# Patient Record
Sex: Female | Born: 1985 | Race: Black or African American | Hispanic: No | Marital: Married | Smoking: Never smoker
Health system: Southern US, Community
[De-identification: ages and names within clinical notes are randomized; demographics above are authoritative.]

## PROBLEM LIST (undated history)

## (undated) ENCOUNTER — Inpatient Hospital Stay (HOSPITAL_COMMUNITY): Payer: Self-pay

## (undated) DIAGNOSIS — Z789 Other specified health status: Secondary | ICD-10-CM

## (undated) HISTORY — PX: NO PAST SURGERIES: SHX2092

---

## 2008-07-04 ENCOUNTER — Ambulatory Visit (HOSPITAL_COMMUNITY): Admission: RE | Admit: 2008-07-04 | Discharge: 2008-07-04 | Payer: Self-pay | Admitting: Orthopedic Surgery

## 2009-12-15 ENCOUNTER — Emergency Department (HOSPITAL_COMMUNITY): Admission: EM | Admit: 2009-12-15 | Discharge: 2009-12-15 | Payer: Self-pay | Admitting: Emergency Medicine

## 2013-08-01 ENCOUNTER — Other Ambulatory Visit: Payer: Self-pay | Admitting: Infectious Disease

## 2013-08-01 ENCOUNTER — Ambulatory Visit
Admission: RE | Admit: 2013-08-01 | Discharge: 2013-08-01 | Disposition: A | Discharge status: Home / Self Care | Payer: No Typology Code available for payment source | Source: Ambulatory Visit | Attending: Infectious Disease | Admitting: Infectious Disease

## 2013-08-01 DIAGNOSIS — R7611 Nonspecific reaction to tuberculin skin test without active tuberculosis: Secondary | ICD-10-CM

## 2013-12-11 ENCOUNTER — Ambulatory Visit (INDEPENDENT_AMBULATORY_CARE_PROVIDER_SITE_OTHER): Payer: BC Managed Care – PPO | Admitting: Emergency Medicine

## 2013-12-11 VITALS — BP 120/74 | HR 68 | Temp 98.2°F | Resp 16 | Ht 62.5 in | Wt 142.8 lb

## 2013-12-11 DIAGNOSIS — Z0000 Encounter for general adult medical examination without abnormal findings: Secondary | ICD-10-CM

## 2013-12-11 DIAGNOSIS — Z0289 Encounter for other administrative examinations: Secondary | ICD-10-CM

## 2013-12-11 NOTE — Progress Notes (Addendum)
   Subjective:  This chart was scribed for Viviann Spare A. Nathan Moctezuma MD,   by Ashley Jacobs, [facility] Scribe. The patient was seen in room 8 and the patient's care was started at 1:28 PM.  Patient ID: Carolyn Webb, female    DOB: [birth year], 28 y.o.   MRN: [id number] Chief Complaint  Patient presents with  . Annual Exam    work   HPI HPI Comments: Carolyn Webb is a 28 y.o. female who arrives to the [facility] for an annual exam. Pt denies any prior medical conditions.She has seasonal allergies that is mostly in the Spring. Angelique Blonder possible pregnancy. Pt has an IUD. 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    There are no active problems to display for this patient.  History reviewed. No pertinent past medical history. History reviewed. No pertinent past surgical history. Allergies  Allergen Reactions  . Sulfa Antibiotics Nausea Only   Prior to Admission medications   Not on File   History   Social History  . Marital Status: Single    Spouse Name: N/A    Number of Children: N/A  . Years of Education: N/A   Occupational History  . Not on file.   Social History Main Topics  . Smoking status: Never Smoker   . Smokeless tobacco: Not on file  . Alcohol Use: 0.6 oz/week    1 Cans of beer per week  . Drug Use: No  . Sexual Activity: Not on file   Other Topics Concern  . Not on file   Social History Narrative  . No narrative on file     \Review of Systems  Genitourinary: Negative for menstrual problem.  Allergic/Immunologic: Positive for environmental allergies.       Objective:   Physical Exam  CONSTITUTIONAL: Well developed/well nourished HEAD: Normocephalic/atraumatic EYES: EOMI/PERRL ENMT: Mucous membranes moist NECK: supple no meningeal signs SPINE:entire spine nontender CV: S1/S2 noted, no murmurs/rubs/gallops noted LUNGS: Lungs are clear to  auscultation bilaterally, no apparent distress ABDOMEN: soft, nontender, no rebound or guarding GU:no cva tenderness NEURO: Pt is awake/alert, moves all extremitiesx4 EXTREMITIES: pulses normal, full ROM SKIN: warm, color normal PSYCH: no abnormalities of mood noted        Assessment & Plan:  This is a healthy 28 year old female. 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 She states she has had previous chest x-ray and quantiferrin gold I personally performed the services described in this documentation, which was scribed in my presence. The recorded information has been reviewed and is accurate.chest x-rays in the system and was done in January 2015 and showed no acute disease

## 2015-05-14 ENCOUNTER — Other Ambulatory Visit (HOSPITAL_COMMUNITY)
Admission: RE | Admit: 2015-05-14 | Discharge: 2015-05-14 | Disposition: A | Discharge status: Home / Self Care | Payer: Commercial Managed Care - PPO | Source: Ambulatory Visit | Attending: Gynecology | Admitting: Gynecology

## 2015-05-14 ENCOUNTER — Encounter: Payer: Self-pay | Admitting: Women's Health

## 2015-05-14 ENCOUNTER — Ambulatory Visit (INDEPENDENT_AMBULATORY_CARE_PROVIDER_SITE_OTHER): Payer: Commercial Managed Care - PPO | Admitting: Women's Health

## 2015-05-14 VITALS — BP 118/78 | Ht 62.0 in | Wt 133.0 lb

## 2015-05-14 DIAGNOSIS — Z30011 Encounter for initial prescription of contraceptive pills: Secondary | ICD-10-CM

## 2015-05-14 DIAGNOSIS — Z30432 Encounter for removal of intrauterine contraceptive device: Secondary | ICD-10-CM

## 2015-05-14 DIAGNOSIS — Z01419 Encounter for gynecological examination (general) (routine) without abnormal findings: Secondary | ICD-10-CM | POA: Insufficient documentation

## 2015-05-14 DIAGNOSIS — Z23 Encounter for immunization: Secondary | ICD-10-CM | POA: Diagnosis not present

## 2015-05-14 LAB — CBC WITH DIFFERENTIAL/PLATELET
BASOS PCT: 1 % (ref 0–1)
Basophils Absolute: 0 10*3/uL (ref 0.0–0.1)
EOS ABS: 0.1 10*3/uL (ref 0.0–0.7)
Eosinophils Relative: 3 % (ref 0–5)
HCT: 39.6 % (ref 36.0–46.0)
HEMOGLOBIN: 13.5 g/dL (ref 12.0–15.0)
LYMPHS ABS: 1.4 10*3/uL (ref 0.7–4.0)
Lymphocytes Relative: 37 % (ref 12–46)
MCH: 31 pg (ref 26.0–34.0)
MCHC: 34.1 g/dL (ref 30.0–36.0)
MCV: 91 fL (ref 78.0–100.0)
MONO ABS: 0.3 10*3/uL (ref 0.1–1.0)
MONOS PCT: 8 % (ref 3–12)
MPV: 9 fL (ref 8.6–12.4)
NEUTROS ABS: 1.9 10*3/uL (ref 1.7–7.7)
Neutrophils Relative %: 51 % (ref 43–77)
Platelets: 356 10*3/uL (ref 150–400)
RBC: 4.35 MIL/uL (ref 3.87–5.11)
RDW: 14 % (ref 11.5–15.5)
WBC: 3.7 10*3/uL — ABNORMAL LOW (ref 4.0–10.5)

## 2015-05-14 MED ORDER — NORGESTIMATE-ETH ESTRADIOL 0.25-35 MG-MCG PO TABS: 1.0000 | ORAL_TABLET | Freq: Every day | ORAL | Status: DC

## 2015-05-14 NOTE — Patient Instructions (Signed)
Oral Contraception Information Oral contraceptive pills (OCPs) are medicines taken to prevent pregnancy. OCPs work by preventing the ovaries from releasing eggs. The hormones in OCPs also cause the cervical mucus to thicken, preventing the sperm from entering the uterus. The hormones also cause the uterine lining to become thin, not allowing a fertilized egg to attach to the inside of the uterus. OCPs are highly effective when taken exactly as prescribed. However, OCPs do not prevent sexually transmitted diseases (STDs). Safe sex practices, such as using condoms along with the pill, can help prevent STDs.  Before taking the pill, you may have a physical exam and Pap test. Your health care provider may order blood tests. The health care provider will make sure you are a good candidate for oral contraception. Discuss with your health care provider the possible side effects of the OCP you may be prescribed. When starting an OCP, it can take 2 to 3 months for the body to adjust to the changes in hormone levels in your body.  TYPES OF ORAL CONTRACEPTION  The combination pill--This pill contains estrogen and progestin (synthetic progesterone) hormones. The combination pill comes in 21-day, 28-day, or 91-day packs. Some types of combination pills are meant to be taken continuously (365-day pills). With 21-day packs, you do not take pills for 7 days after the last pill. With 28-day packs, the pill is taken every day. The last 7 pills are without hormones. Certain types of pills have more than 21 hormone-containing pills. With 91-day packs, the first 84 pills contain both hormones, and the last 7 pills contain no hormones or contain estrogen only.  The minipill--This pill contains the progesterone hormone only. The pill is taken every day continuously. It is very important to take the pill at the same time each day. The minipill comes in packs of 28 pills. All 28 pills contain the hormone.  ADVANTAGES OF ORAL  CONTRACEPTIVE PILLS  Decreases premenstrual symptoms.   Treats menstrual period cramps.   Regulates the menstrual cycle.   Decreases a heavy menstrual flow.   May treatacne, depending on the type of pill.   Treats abnormal uterine bleeding.   Treats polycystic ovarian syndrome.   Treats endometriosis.   Can be used as emergency contraception.  THINGS THAT CAN MAKE ORAL CONTRACEPTIVE PILLS LESS EFFECTIVE OCPs can be less effective if:   You forget to take the pill at the same time every day.   You have a stomach or intestinal disease that lessens the absorption of the pill.   You take OCPs with other medicines that make OCPs less effective, such as antibiotics, certain HIV medicines, and some seizure medicines.   You take expired OCPs.   You forget to restart the pill on day 7, when using the packs of 21 pills.  RISKS ASSOCIATED WITH ORAL CONTRACEPTIVE PILLS  Oral contraceptive pills can sometimes cause side effects, such as:  Headache.  Nausea.  Breast tenderness.  Irregular bleeding or spotting. Combination pills are also associated with a small increased risk of:  Blood clots.  Heart attack.  Stroke.   This information is not intended to replace advice given to you by your health care provider. Make sure you discuss any questions you have with your health care provider.   Document Released: 09/24/2002 Document Revised: 04/24/2013 Document Reviewed: 12/23/2012 Elsevier Interactive Patient Education 2016 Elsevier Inc. Health Maintenance, Female Adopting a healthy lifestyle and getting preventive care can go a long way to promote health and wellness. Talk with   your health care provider about what schedule of regular examinations is right for you. This is a good chance for you to check in with your provider about disease prevention and staying healthy. In between checkups, there are plenty of things you can do on your own. Experts have done a lot  of research about which lifestyle changes and preventive measures are most likely to keep you healthy. Ask your health care provider for more information. WEIGHT AND DIET  Eat a healthy diet  Be sure to include plenty of vegetables, fruits, low-fat dairy products, and lean protein.  Do not eat a lot of foods high in solid fats, added sugars, or salt.  Get regular exercise. This is one of the most important things you can do for your health.  Most adults should exercise for at least 150 minutes each week. The exercise should increase your heart rate and make you sweat (moderate-intensity exercise).  Most adults should also do strengthening exercises at least twice a week. This is in addition to the moderate-intensity exercise.  Maintain a healthy weight  Body mass index (BMI) is a measurement that can be used to identify possible weight problems. It estimates body fat based on height and weight. Your health care provider can help determine your BMI and help you achieve or maintain a healthy weight.  For females 20 years of age and older:   A BMI below 18.5 is considered underweight.  A BMI of 18.5 to 24.9 is normal.  A BMI of 25 to 29.9 is considered overweight.  A BMI of 30 and above is considered obese.  Watch levels of cholesterol and blood lipids  You should start having your blood tested for lipids and cholesterol at 27 years of age, then have this test every 5 years.  You may need to have your cholesterol levels checked more often if:  Your lipid or cholesterol levels are high.  You are older than 27 years of age.  You are at high risk for heart disease.  CANCER SCREENING   Lung Cancer  Lung cancer screening is recommended for adults 55-80 years old who are at high risk for lung cancer because of a history of smoking.  A yearly low-dose CT scan of the lungs is recommended for people who:  Currently smoke.  Have quit within the past 15 years.  Have at least a  30-pack-year history of smoking. A pack year is smoking an average of one pack of cigarettes a day for 1 year.  Yearly screening should continue until it has been 15 years since you quit.  Yearly screening should stop if you develop a health problem that would prevent you from having lung cancer treatment.  Breast Cancer  Practice breast self-awareness. This means understanding how your breasts normally appear and feel.  It also means doing regular breast self-exams. Let your health care provider know about any changes, no matter how small.  If you are in your 20s or 30s, you should have a clinical breast exam (CBE) by a health care provider every 1-3 years as part of a regular health exam.  If you are 40 or older, have a CBE every year. Also consider having a breast X-ray (mammogram) every year.  If you have a family history of breast cancer, talk to your health care provider about genetic screening.  If you are at high risk for breast cancer, talk to your health care provider about having an MRI and a mammogram every year.    Breast cancer gene (BRCA) assessment is recommended for women who have family members with BRCA-related cancers. BRCA-related cancers include:  Breast.  Ovarian.  Tubal.  Peritoneal cancers.  Results of the assessment will determine the need for genetic counseling and BRCA1 and BRCA2 testing. Cervical Cancer Your health care provider may recommend that you be screened regularly for cancer of the pelvic organs (ovaries, uterus, and vagina). This screening involves a pelvic examination, including checking for microscopic changes to the surface of your cervix (Pap test). You may be encouraged to have this screening done every 3 years, beginning at age 21.  For women ages 30-65, health care providers may recommend pelvic exams and Pap testing every 3 years, or they may recommend the Pap and pelvic exam, combined with testing for human papilloma virus (HPV), every 5  years. Some types of HPV increase your risk of cervical cancer. Testing for HPV may also be done on women of any age with unclear Pap test results.  Other health care providers may not recommend any screening for nonpregnant women who are considered low risk for pelvic cancer and who do not have symptoms. Ask your health care provider if a screening pelvic exam is right for you.  If you have had past treatment for cervical cancer or a condition that could lead to cancer, you need Pap tests and screening for cancer for at least 20 years after your treatment. If Pap tests have been discontinued, your risk factors (such as having a new sexual partner) need to be reassessed to determine if screening should resume. Some women have medical problems that increase the chance of getting cervical cancer. In these cases, your health care provider may recommend more frequent screening and Pap tests. Colorectal Cancer  This type of cancer can be detected and often prevented.  Routine colorectal cancer screening usually begins at 27 years of age and continues through 27 years of age.  Your health care provider may recommend screening at an earlier age if you have risk factors for colon cancer.  Your health care provider may also recommend using home test kits to check for hidden blood in the stool.  A small camera at the end of a tube can be used to examine your colon directly (sigmoidoscopy or colonoscopy). This is done to check for the earliest forms of colorectal cancer.  Routine screening usually begins at age 50.  Direct examination of the colon should be repeated every 5-10 years through 27 years of age. However, you may need to be screened more often if early forms of precancerous polyps or small growths are found. Skin Cancer  Check your skin from head to toe regularly.  Tell your health care provider about any new moles or changes in moles, especially if there is a change in a mole's shape or  color.  Also tell your health care provider if you have a mole that is larger than the size of a pencil eraser.  Always use sunscreen. Apply sunscreen liberally and repeatedly throughout the day.  Protect yourself by wearing long sleeves, pants, a wide-brimmed hat, and sunglasses whenever you are outside. HEART DISEASE, DIABETES, AND HIGH BLOOD PRESSURE   High blood pressure causes heart disease and increases the risk of stroke. High blood pressure is more likely to develop in:  People who have blood pressure in the high end of the normal range (130-139/85-89 mm Hg).  People who are overweight or obese.  People who are African American.  If you   are 18-39 years of age, have your blood pressure checked every 3-5 years. If you are 40 years of age or older, have your blood pressure checked every year. You should have your blood pressure measured twice--once when you are at a hospital or clinic, and once when you are not at a hospital or clinic. Record the average of the two measurements. To check your blood pressure when you are not at a hospital or clinic, you can use:  An automated blood pressure machine at a pharmacy.  A home blood pressure monitor.  If you are between 55 years and 79 years old, ask your health care provider if you should take aspirin to prevent strokes.  Have regular diabetes screenings. This involves taking a blood sample to check your fasting blood sugar level.  If you are at a normal weight and have a low risk for diabetes, have this test once every three years after 27 years of age.  If you are overweight and have a high risk for diabetes, consider being tested at a younger age or more often. PREVENTING INFECTION  Hepatitis B  If you have a higher risk for hepatitis B, you should be screened for this virus. You are considered at high risk for hepatitis B if:  You were born in a country where hepatitis B is common. Ask your health care provider which countries  are considered high risk.  Your parents were born in a high-risk country, and you have not been immunized against hepatitis B (hepatitis B vaccine).  You have HIV or AIDS.  You use needles to inject street drugs.  You live with someone who has hepatitis B.  You have had sex with someone who has hepatitis B.  You get hemodialysis treatment.  You take certain medicines for conditions, including cancer, organ transplantation, and autoimmune conditions. Hepatitis C  Blood testing is recommended for:  Everyone born from 1945 through 1965.  Anyone with known risk factors for hepatitis C. Sexually transmitted infections (STIs)  You should be screened for sexually transmitted infections (STIs) including gonorrhea and chlamydia if:  You are sexually active and are younger than 27 years of age.  You are older than 27 years of age and your health care provider tells you that you are at risk for this type of infection.  Your sexual activity has changed since you were last screened and you are at an increased risk for chlamydia or gonorrhea. Ask your health care provider if you are at risk.  If you do not have HIV, but are at risk, it may be recommended that you take a prescription medicine daily to prevent HIV infection. This is called pre-exposure prophylaxis (PrEP). You are considered at risk if:  You are sexually active and do not regularly use condoms or know the HIV status of your partner(s).  You take drugs by injection.  You are sexually active with a partner who has HIV. Talk with your health care provider about whether you are at high risk of being infected with HIV. If you choose to begin PrEP, you should first be tested for HIV. You should then be tested every 3 months for as long as you are taking PrEP.  PREGNANCY   If you are premenopausal and you may become pregnant, ask your health care provider about preconception counseling.  If you may become pregnant, take 400 to  800 micrograms (mcg) of folic acid every day.  If you want to prevent pregnancy, talk to your   health care provider about birth control (contraception). OSTEOPOROSIS AND MENOPAUSE   Osteoporosis is a disease in which the bones lose minerals and strength with aging. This can result in serious bone fractures. Your risk for osteoporosis can be identified using a bone density scan.  If you are 65 years of age or older, or if you are at risk for osteoporosis and fractures, ask your health care provider if you should be screened.  Ask your health care provider whether you should take a calcium or vitamin D supplement to lower your risk for osteoporosis.  Menopause may have certain physical symptoms and risks.  Hormone replacement therapy may reduce some of these symptoms and risks. Talk to your health care provider about whether hormone replacement therapy is right for you.  HOME CARE INSTRUCTIONS   Schedule regular health, dental, and eye exams.  Stay current with your immunizations.   Do not use any tobacco products including cigarettes, chewing tobacco, or electronic cigarettes.  If you are pregnant, do not drink alcohol.  If you are breastfeeding, limit how much and how often you drink alcohol.  Limit alcohol intake to no more than 1 drink per day for nonpregnant women. One drink equals 12 ounces of beer, 5 ounces of wine, or 1 ounces of hard liquor.  Do not use street drugs.  Do not share needles.  Ask your health care provider for help if you need support or information about quitting drugs.  Tell your health care provider if you often feel depressed.  Tell your health care provider if you have ever been abused or do not feel safe at home.   This information is not intended to replace advice given to you by your health care provider. Make sure you discuss any questions you have with your health care provider.   Document Released: 01/17/2011 Document Revised: 07/25/2014  Document Reviewed: 06/05/2013 Elsevier Interactive Patient Education 2016 Elsevier Inc.  

## 2015-05-14 NOTE — Progress Notes (Signed)
Carolyn AveShakia F Webb 01/15/1986 [id number]    History:    Presents for new patient annual exam requesting IUD removal. Mirena IUD placed 3 years ago amenorrhea. Normal Pap history. Did not complete gardasil series. Same partner years. Currently on doxycycline for acne.  Past medical history, past surgical history, family history and social history were all reviewed and documented in the EPIC chart. 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 [employer] and [employer] in [employer].  ROS:  A ROS was performed and pertinent positives and negatives are included.  Exam:  Filed Vitals:   05/14/15 1211  BP: 118/78    General appearance:  Normal Thyroid:  Symmetrical, normal in size, without palpable masses or nodularity. Respiratory  Auscultation:  Clear without wheezing or rhonchi Cardiovascular  Auscultation:  Regular rate, without rubs, murmurs or gallops  Edema/varicosities:  Not grossly evident Abdominal  Soft,nontender, without masses, guarding or rebound.  Liver/spleen:  No organomegaly noted  Hernia:  None appreciated  Skin  Inspection:  Grossly normal   Breasts: Examined lying and sitting.     Right: Without masses, retractions, discharge or axillary adenopathy.     Left: Without masses, retractions, discharge or axillary adenopathy. Gentitourinary   Inguinal/mons:  Normal without inguinal adenopathy  External genitalia:  Normal  BUS/Urethra/Skene's glands:  Normal  Vagina:  Normal  Cervix:  Normal IUD strings seen, ring forcep use removed intact shown to patient and discarded  Uterus:  normal in size, shape and contour.  Midline and mobile  Adnexa/parametria:     Rt: Without masses or tenderness.   Lt: Without masses or tenderness.  Anus and perineum: Normal    Assessment/Plan:  29 y.o. MBF G0  for annual exam.     IUD removed Contraception management Acne  Plan: Options reviewed will try Sprintec prescription, proper use given and reviewed will start  Sunday take daily. Reviewed importance of condoms first month and then contraceptive. Reviewed may help with acne. Reviewed importance of using contraception the entire time on tetracycline for acne per dermatologist. SBE's, exercise, calcium rich diet, MVI daily encouraged. CBC, UA, rubella titer, Pap.    Harrington ChallengerYOUNG,NANCY J WHNP, 1:11 PM 05/14/2015

## 2015-05-15 ENCOUNTER — Telehealth: Payer: Self-pay | Admitting: *Deleted

## 2015-05-15 LAB — URINALYSIS W MICROSCOPIC + REFLEX CULTURE
BACTERIA UA: NONE SEEN [HPF]
Bilirubin Urine: NEGATIVE
CRYSTALS: NONE SEEN [HPF]
Casts: NONE SEEN [LPF]
Glucose, UA: NEGATIVE
Hgb urine dipstick: NEGATIVE
Ketones, ur: NEGATIVE
LEUKOCYTES UA: NEGATIVE
Nitrite: NEGATIVE
PROTEIN: NEGATIVE
RBC / HPF: NONE SEEN RBC/HPF (ref <=2)
SPECIFIC GRAVITY, URINE: 1.027 (ref 1.001–1.035)
WBC, UA: NONE SEEN WBC/HPF (ref <=5)
YEAST: NONE SEEN [HPF]
pH: 6 (ref 5.0–8.0)

## 2015-05-15 LAB — RUBELLA SCREEN: RUBELLA: 13.5 {index} — AB (ref <0.90)

## 2015-05-15 NOTE — Telephone Encounter (Signed)
Pt requested Flu Vaccine documentation be sent to her job. Fax # [phone] Lyn RecordsAttn Marcie. Complete KW CMA

## 2015-05-19 LAB — CYTOLOGY - PAP

## 2016-03-31 ENCOUNTER — Other Ambulatory Visit (HOSPITAL_COMMUNITY): Payer: Self-pay | Admitting: Obstetrics and Gynecology

## 2016-03-31 ENCOUNTER — Encounter: Payer: Self-pay | Admitting: Genetic Counselor

## 2016-03-31 DIAGNOSIS — N926 Irregular menstruation, unspecified: Secondary | ICD-10-CM

## 2016-04-05 ENCOUNTER — Ambulatory Visit (HOSPITAL_COMMUNITY)
Admission: RE | Admit: 2016-04-05 | Discharge: 2016-04-05 | Disposition: A | Discharge status: Home / Self Care | Payer: Commercial Managed Care - PPO | Source: Ambulatory Visit | Attending: Obstetrics and Gynecology | Admitting: Obstetrics and Gynecology

## 2016-04-05 ENCOUNTER — Encounter (INDEPENDENT_AMBULATORY_CARE_PROVIDER_SITE_OTHER): Payer: Self-pay

## 2016-04-05 DIAGNOSIS — N926 Irregular menstruation, unspecified: Secondary | ICD-10-CM

## 2016-04-05 MED ORDER — IOPAMIDOL (ISOVUE-300) INJECTION 61%
30.0000 mL | Freq: Once | INTRAVENOUS | Status: AC | PRN
  Administered 2016-04-05: 6 mL

## 2016-04-06 ENCOUNTER — Telehealth: Payer: Self-pay | Admitting: Genetic Counselor

## 2016-04-06 NOTE — Telephone Encounter (Signed)
Pt returned call and was [facility] the [facility] schedule [facility] due to the type of genetic testing requested.  The referring provider was also contacted and [facility].

## 2016-04-21 ENCOUNTER — Ambulatory Visit (HOSPITAL_COMMUNITY): Payer: Commercial Managed Care - PPO

## 2016-04-21 ENCOUNTER — Ambulatory Visit (HOSPITAL_COMMUNITY)
Admission: RE | Admit: 2016-04-21 | Discharge: 2016-04-21 | Disposition: A | Discharge status: Home / Self Care | Payer: Commercial Managed Care - PPO | Source: Ambulatory Visit | Attending: Obstetrics and Gynecology | Admitting: Obstetrics and Gynecology

## 2016-04-21 DIAGNOSIS — Z820 Family history of epilepsy and other diseases of the nervous system: Secondary | ICD-10-CM

## 2016-04-22 ENCOUNTER — Encounter (HOSPITAL_COMMUNITY): Payer: Self-pay

## 2016-04-22 DIAGNOSIS — Z820 Family history of epilepsy and other diseases of the nervous system: Secondary | ICD-10-CM | POA: Insufficient documentation

## 2016-04-22 NOTE — Progress Notes (Signed)
Genetic Counseling  Preconception Note  Appointment Date:  04/21/2016 Referred By: Servando Salina, MD Date of Birth:  1986-01-29 Attending: Renella Cunas, MD    I met with [name] for preconception genetic counseling because of a family history of Duchenne muscular dystrophy.  In summary:  Discussed family history of Duchenne muscular dystrophy  Patient's two maternal uncles, two maternal great-uncles, and a maternal female cousin reported to have had DMD  Reviewed X-linked inheritance of DMD  Carrier females have a 1 in 34 chance for affected son and 1 in 36 chance for daughter who is a carrier  Prior to screening for the patient, her risk to be a carrier is 1 in 4 (25%)  DMD carrier screening  Given when the affected relatives were diagnosed and passed away, Carolyn Webb thinks genetic testing was not likely performed  Carrier testing is most informative when familial causative variant is first identified  Carrier testing could first start with Carolyn Webb's maternal grandmother (obligate carrier) and then proceed with testing Carolyn Webb for familial variant, if identified  Carrier screening for DMD gene available to Carolyn Webb, in the absence of known familial variant, detection rate approximately 90%  Carolyn Webb plans to investigate her insurance coverage and also inquire with her grandmother about carrier testing; she will call our office back when ready to proceed with DMD carrier testing  Female carriers are recommended to have routine cardiac screening given the increased chance for cardiomyopathy  Discussed general population carrier screening options  CF-performed through OB; currently pending  SMA-declined today; may pursue at later date with additional labwork  Hemoglobinopathies-declined today; may pursue at a later date  We began by reviewing the family history in detail. Carolyn Webb reported multiple maternal relatives with  Duchenne Muscular Dystrophy (DMD). She reported two maternal uncles, who were each reportedly diagnosed in their early teenage years and progressed to requiring the use of a wheelchair. One uncle died at age 63 years old, and the other died at age 61 years old. They were diagnosed and followed through [facility]. The patient reported that her mother is currently 50 years old, and they were older siblings to her mother. Additionally, the patient's maternal grandmother had two brothers with DMD, who also died in their 19's. The patient's maternal grandmother had a maternal half-sister with a son with DMD who died at age 36 years old. Given the time frame for these individuals, the patient reported that she does not think genetic testing was performed for any of the affected relatives or any of the females in the family.  She reported that there are no symptomatic females in the family. Carolyn Webb has two maternal half-sisters who do not have children and no maternal brothers.   Duchenne muscular dystrophy (DMD) is an X-linked genetic condition involving progressive muscular degeneration. DMD is part of the dystrophinopathy spectrum, which includes a range of muscle disease ranging from mild to severe. DMD typically presents in childhood with delayed milestones. Affected children are typically wheelchair dependent by age 58 years. Onset of cardiomyopathy typically occurs after age 74 years. Individuals typically do not survive past their 88s, primarily due to cardiomyopathy and respiratory complications. Males with DMD may have inherited the mutation from a carrier mother or have the condition as the result of a de novo mutation. The de novo mutation rate for DMD has been estimated to be 1/3.   We reviewed genes, chromosomes, and inheritance patterns. We reviewed that the  23rd chromosome pair for humans is referred to as the sex chromosomes. Males typically have one X and one Y chromosome, and females  typically have two X chromosomes. In X-linked recessive inheritance, each female pregnancy of a female carrier has a 50% (1 in 2) risk for DMD.  Each female pregnancy of a female carrier has a 50% to be a carrier.  Thus, there is a 1 in 4 chance for a carrier female to have an affected son. Most carrier females have no symptoms of the disease, although some carriers can have muscle weakness, muscle cramps, heart disease, and rarely can exhibit classic features of DMD.  Symptoms of a female carrier can range even within the same family, just as the disease itself can be variable in affected relatives within the same family. Given the associated risk for cardiomyopathy in carrier females, it is recommended for female carriers to have a complete cardiac evaluation and routine cardiac screening every 5 years starting at approximately age 59-29 years old. While DMD shortens the lifespan of an affected female, this is typically not the case for a carrier female. In X-linked recessive inheritance, all daughters of an affected female would be obligate carriers and all sons of an affected female would be neither affected nor carriers.   Based on the reported family history, we discussed that the patient's maternal grandmother would be an obligate carrier for DMD. Thus, the patient would have a 1 in 4 chance to be a carrier of DMD.  We discussed that in the case of a different type of muscular dystrophy in the family, recurrence risk estimate may change as there are various other forms of muscular dystrophy that have variable features and ages of onset and may follow different inheritance patterns and the available genetic testing for carrier screening would differ.  It would be most informative to obtain medical documentation of the affected relatives' diagnosis to confirm that the underlying muscular dystrophy is DMD.  Molecular testing of the DMD gene is clinically available. We discussed that over 5,000 pathogenic variants  have been identified in the DMD gene for individuals with varying range of dystrophinopathies. For males with DMD, approximately 55-75% are found to have a DMD deletion or duplication, and approximately 20-35% have a pathogenic single nucleotide variant. However, there are some individuals with clinical features of a dystrophinopathy that do not have an identified variant in the DMD gene. Given this information, we discussed that genetic testing is most informative when started in either an affected relative or a female who is an obligate carrier. In Carolyn Webb case, her maternal grandmother, who is an obligate carrier, would be the most informative relative to initiate DMD genetic testing. If a causative pathogenic variant is identified in her, then at risk relatives, such as Carolyn Webb could then be tested for the identified familial variant. Alternatively, we discussed that while potentially less informative, DMD carrier screening is available to Carolyn Webb without testing other relatives. Current DMD genetic testing assessing for duplications, deletions, and through sequence analysis is estimated to have a 90% detection rate for DMD carriers. In this second scenario, we reviewed that a negative result would not rule out carrier status given that the familial variant would be unknown, but would reduce her risk to be a carrier. We also reviewed potential costs of genetic testing. We reviewed benefits and limitations of testing. We discussed the possible results that the tests might provide including: positive, negative, unanticipated, and no result. We also reviewed  that given the de novo mutation rate for DMD, a negative carrier screen, even in the case of a negative test for a known familial variant, cannot completely rule out the chance of having an affected pregnancy.   In the case that a female is identified to carry a DMD pathogenic variant, we discussed options regarding a future pregnancy.  We  reviewed that female carriers have a 1 in 4 (25%) chance to have a female carrier and a 1 in 4 (25%) chance to have an affected son.  Prenatal diagnosis would be available in a future pregnancy for a known familial pathogenic variant via chorionic villus sampling (CVS) or amniocentesis. We reviewed risks, benefits, and limitations of these.  Additionally, while not diagnostic for DMD, screening for fetal sex would be available via noninvasive prenatal screening (NIPS)/prenatal cell free DNA testing and/or targeted ultrasound in order to further refine recurrence risk.  Identified female carriers would also have the option of preimplantation genetic diagnosis (PGD), meaning that embryo(s) obtained through in vitro fertilization would be tested for the familial mutations prior to the implantation of unaffected embryo(s) into the uterus.   Carolyn Webb expressed interest in pursuing DMD carrier testing.  She wanted to first investigate her insurance coverage including the deductible for her specific plan. She also planned to talk with her grandmother about beginning carrier testing with her. Given that her grandmother lives in [place], with the nearest genetic counselor approximately one hour away, we discussed the potential option of coordinating sample collection by the genetics lab performing the test via a sample collection kit being sent to her grandmother. Carolyn Webb has our office contact number. She plans to call us back once she and/or her grandmother desire to proceed with DMD carrier screening.    The family histories were otherwise found to be contributory for cleft lip and palate for the patient's paternal aunt. This relative had surgical correction and is otherwise healthy. No additional relatives were reported with cleft lip +/- palate, including the aunt's two children. We discussed the ultrasound finding of cleft lip and palate. Cleft lip +/- palate occurs in approximately 1 in  1000 births. Approximately 25% (1 in 4) of all cleft lip and/or palate (CL/P) cases are cleft lip only, 50% (1 in 2) are cleft lip and palate, and 25% (1 in 4) are cleft palate only. Various factors may increase the chance of a CL/P including some prenatal exposures, alcohol and drug use, cigarette smoking, or folic acid deficiency. CL/P is most often an isolated condition, but can be present in combination with other birth defects possibly as part of a genetic syndrome. Approximately, 7-13% of individuals with a cleft lip and 11-14% of individuals with a cleft lip and palate are born with additional birth defects.  Many genetic syndromes are associated with cleft lip and/or palate which may not be identified on ultrasound and would not be detected by amniocentesis.  For this reason, a genetics evaluation may be recommended sometime after birth in order to assess for an underlying genetic syndrome.  When there is no syndrome as the cause, then the cleft lip or palate is typically suspected to be caused by a combination of genetic and environmental factors (multifactorial inheritance). Given the reported family history, recurrence risk for cleft lip +/- palate in the patient's offspring would be approximately 0.3%, assuming multifactorial inheritance for this relative.  Without further information regarding the provided family history, an accurate genetic risk cannot be calculated. Further genetic  counseling is warranted if more information is obtained.  Carolyn Webb was provided with written information regarding cystic fibrosis (CF), spinal muscular atrophy (SMA) and hemoglobinopathies including the carrier frequency, availability of carrier screening and prenatal diagnosis if indicated.  In addition, we discussed that CF and hemoglobinopathies are routinely screened for as part of the [place] newborn screening panel. CF carrier screening was recently drawn through her OB office and is currently pending.  After  further discussion, she declined screening for SMA and hemoglobinopathies today given that she may pursue these at a later date, possibly at the time of DMD carrier screening.  I counseled Carolyn Webb regarding the above risks and available options.  The approximate face-to-face time with the genetic counselor was 55 minutes.  Chipper Oman, MS Certified Genetic Counselor 04/22/2016

## 2016-05-03 ENCOUNTER — Telehealth (HOSPITAL_COMMUNITY): Payer: Self-pay | Admitting: MS"

## 2016-05-03 NOTE — Telephone Encounter (Signed)
Called [name] to follow-up regarding possibility of blood draw for her grandmother for DMD carrier screening. Per discussion with [facility], one of the labs that offers DMD carrier screening, the lab can arrange to send a kit and phlebotomist to the grandmother's house to collect the specimen. Carolyn Webb had a concern about potential driving distance for her grandmother for a blood drawn, given that she lives in [place]. She was happy to hear of this possibility. She stated she is still in the process of checking regarding whether or not her grandmother's insurance would cover this testing. Discussed that once she obtains the type of insurance her grandmother has, we can also check with the laboratory to see if they are in-network or out of network with her insurance provider. Carolyn Webb plans to call me back once she has additional information and/or if she or her grandmother would like to pursue DMD carrier screening.   Carolyn Webb 05/03/2016 11:01 AM

## 2016-05-16 ENCOUNTER — Other Ambulatory Visit: Payer: Commercial Managed Care - PPO

## 2016-05-16 ENCOUNTER — Encounter: Payer: Commercial Managed Care - PPO | Admitting: Genetic Counselor

## 2017-07-18 NOTE — L&D Delivery Note (Signed)
  CTSP re SROM with breech presenting @ introitus  Delivery Note At  a non-viable adult was delivered via  (PresentationFrank breech: ;  ).  APGAR: , ; weight  .   Placenta statusSPONT>>INTACT: , .  Cord:  with the following complications: .  Cord pH: not sent  placetta tp PATH, difficult entrapped vtx after easy breecdh delivery, no FHR noted, NICU here for obsv>>>only agonal , weak pulse  Anesthesia:  IV Stadol Episiotomy:  none Lacerations:  none Suture Repair: NA Est. Blood Loss (mL):  200  Mom to postpartum.  [facility].  Maria Gallicchio Milana ObeyM Nimo Verastegui 10/07/2017, 6:05 PM

## 2017-10-07 ENCOUNTER — Inpatient Hospital Stay (HOSPITAL_COMMUNITY): Payer: BLUE CROSS/BLUE SHIELD

## 2017-10-07 ENCOUNTER — Encounter (HOSPITAL_COMMUNITY): Payer: Self-pay

## 2017-10-07 ENCOUNTER — Encounter (HOSPITAL_COMMUNITY): Payer: Self-pay | Admitting: Emergency Medicine

## 2017-10-07 ENCOUNTER — Inpatient Hospital Stay (HOSPITAL_COMMUNITY)
Admission: AD | Admit: 2017-10-07 | Discharge: 2017-10-08 | DRG: 805 | Disposition: A | Discharge status: Home / Self Care | Payer: BLUE CROSS/BLUE SHIELD | Source: Ambulatory Visit | Attending: Obstetrics and Gynecology | Admitting: Obstetrics and Gynecology

## 2017-10-07 ENCOUNTER — Other Ambulatory Visit: Payer: Self-pay

## 2017-10-07 ENCOUNTER — Emergency Department (HOSPITAL_COMMUNITY)
Admission: EM | Admit: 2017-10-07 | Discharge: 2017-10-07 | Disposition: A | Discharge status: Home / Self Care | Payer: BLUE CROSS/BLUE SHIELD | Attending: Emergency Medicine | Admitting: Emergency Medicine

## 2017-10-07 DIAGNOSIS — O209 Hemorrhage in early pregnancy, unspecified: Secondary | ICD-10-CM

## 2017-10-07 DIAGNOSIS — N883 Incompetence of cervix uteri: Secondary | ICD-10-CM

## 2017-10-07 DIAGNOSIS — Z3A21 21 weeks gestation of pregnancy: Secondary | ICD-10-CM

## 2017-10-07 DIAGNOSIS — O9989 Other specified diseases and conditions complicating pregnancy, childbirth and the puerperium: Secondary | ICD-10-CM | POA: Insufficient documentation

## 2017-10-07 DIAGNOSIS — O200 Threatened abortion: Secondary | ICD-10-CM

## 2017-10-07 DIAGNOSIS — R109 Unspecified abdominal pain: Secondary | ICD-10-CM | POA: Insufficient documentation

## 2017-10-07 DIAGNOSIS — O321XX0 Maternal care for breech presentation, not applicable or unspecified: Secondary | ICD-10-CM | POA: Diagnosis present

## 2017-10-07 DIAGNOSIS — O3432 Maternal care for cervical incompetence, second trimester: Secondary | ICD-10-CM | POA: Diagnosis present

## 2017-10-07 HISTORY — DX: Other specified health status: Z78.9

## 2017-10-07 LAB — CBC WITH DIFFERENTIAL/PLATELET
BASOS ABS: 0 10*3/uL (ref 0.0–0.1)
BASOS PCT: 0 %
Eosinophils Absolute: 0.2 10*3/uL (ref 0.0–0.7)
Eosinophils Relative: 2 %
HEMATOCRIT: 34.5 % — AB (ref 36.0–46.0)
HEMOGLOBIN: 12.1 g/dL (ref 12.0–15.0)
Lymphocytes Relative: 28 %
Lymphs Abs: 1.9 10*3/uL (ref 0.7–4.0)
MCH: 31.7 pg (ref 26.0–34.0)
MCHC: 35.1 g/dL (ref 30.0–36.0)
MCV: 90.3 fL (ref 78.0–100.0)
MONOS PCT: 9 %
Monocytes Absolute: 0.6 10*3/uL (ref 0.1–1.0)
NEUTROS ABS: 4 10*3/uL (ref 1.7–7.7)
NEUTROS PCT: 61 %
Platelets: 309 10*3/uL (ref 150–400)
RBC: 3.82 MIL/uL — ABNORMAL LOW (ref 3.87–5.11)
RDW: 13.4 % (ref 11.5–15.5)
WBC: 6.6 10*3/uL (ref 4.0–10.5)

## 2017-10-07 LAB — ABO/RH: ABO/RH(D): A POS

## 2017-10-07 LAB — TYPE AND SCREEN
ABO/RH(D): A POS
Antibody Screen: NEGATIVE

## 2017-10-07 MED ORDER — BUTORPHANOL TARTRATE 1 MG/ML IJ SOLN
1.0000 mg | Freq: Once | INTRAMUSCULAR | Status: AC
  Administered 2017-10-07: 1 mg via INTRAVENOUS
  Filled 2017-10-07: qty 1

## 2017-10-07 MED ORDER — DIBUCAINE 1 % RE OINT
1.0000 "application " | TOPICAL_OINTMENT | RECTAL | Status: DC | PRN
  Filled 2017-10-07: qty 28

## 2017-10-07 MED ORDER — ONDANSETRON HCL 4 MG/2ML IJ SOLN: 4.0000 mg | INTRAMUSCULAR | Status: DC | PRN

## 2017-10-07 MED ORDER — FLEET ENEMA 7-19 GM/118ML RE ENEM: 1.0000 | ENEMA | Freq: Every day | RECTAL | Status: DC | PRN

## 2017-10-07 MED ORDER — SENNOSIDES-DOCUSATE SODIUM 8.6-50 MG PO TABS: 2.0000 | ORAL_TABLET | ORAL | Status: DC

## 2017-10-07 MED ORDER — BISACODYL 10 MG RE SUPP
10.0000 mg | Freq: Every day | RECTAL | Status: DC | PRN
  Filled 2017-10-07: qty 1

## 2017-10-07 MED ORDER — AMPICILLIN SODIUM 2 G IJ SOLR
2.0000 g | Freq: Four times a day (QID) | INTRAMUSCULAR | Status: DC
  Administered 2017-10-07 (×2): 2 g via INTRAVENOUS
  Filled 2017-10-07: qty 2000
  Filled 2017-10-07: qty 2
  Filled 2017-10-07: qty 2000
  Filled 2017-10-07: qty 2

## 2017-10-07 MED ORDER — ZOLPIDEM TARTRATE 5 MG PO TABS: 5.0000 mg | ORAL_TABLET | Freq: Every evening | ORAL | Status: DC | PRN

## 2017-10-07 MED ORDER — OXYCODONE-ACETAMINOPHEN 5-325 MG PO TABS: 1.0000 | ORAL_TABLET | ORAL | Status: DC | PRN

## 2017-10-07 MED ORDER — LACTATED RINGERS IV SOLN
INTRAVENOUS | Status: DC
  Administered 2017-10-07: 04:00:00 via INTRAVENOUS

## 2017-10-07 MED ORDER — WITCH HAZEL-GLYCERIN EX PADS: 1.0000 "application " | MEDICATED_PAD | CUTANEOUS | Status: DC | PRN

## 2017-10-07 MED ORDER — OXYCODONE-ACETAMINOPHEN 5-325 MG PO TABS: 2.0000 | ORAL_TABLET | ORAL | Status: DC | PRN

## 2017-10-07 MED ORDER — LACTATED RINGERS IV SOLN
500.0000 mL | INTRAVENOUS | Status: DC | PRN
  Administered 2017-10-07: 500 mL via INTRAVENOUS

## 2017-10-07 MED ORDER — BENZOCAINE-MENTHOL 20-0.5 % EX AERO
1.0000 "application " | INHALATION_SPRAY | CUTANEOUS | Status: DC | PRN
  Filled 2017-10-07: qty 56

## 2017-10-07 MED ORDER — MAGNESIUM SULFATE 40 G IN LACTATED RINGERS - SIMPLE
2.0000 g/h | INTRAVENOUS | Status: DC
  Administered 2017-10-07: 1 g/h via INTRAVENOUS
  Filled 2017-10-07: qty 40

## 2017-10-07 MED ORDER — SODIUM CHLORIDE 0.9 % IV SOLN: 250.0000 mg | Freq: Four times a day (QID) | INTRAVENOUS | Status: DC

## 2017-10-07 MED ORDER — ONDANSETRON HCL 4 MG PO TABS: 4.0000 mg | ORAL_TABLET | ORAL | Status: DC | PRN

## 2017-10-07 MED ORDER — IBUPROFEN 800 MG PO TABS: 800.0000 mg | ORAL_TABLET | Freq: Three times a day (TID) | ORAL | Status: DC | PRN

## 2017-10-07 MED ORDER — AZITHROMYCIN 500 MG PO TABS: 500.0000 mg | ORAL_TABLET | Freq: Every day | ORAL | Status: DC

## 2017-10-07 MED ORDER — PRENATAL MULTIVITAMIN CH: 1.0000 | ORAL_TABLET | Freq: Every day | ORAL | Status: DC

## 2017-10-07 MED ORDER — SOD CITRATE-CITRIC ACID 500-334 MG/5ML PO SOLN: 30.0000 mL | ORAL | Status: DC | PRN

## 2017-10-07 MED ORDER — AMOXICILLIN 500 MG PO CAPS: 500.0000 mg | ORAL_CAPSULE | Freq: Three times a day (TID) | ORAL | Status: DC

## 2017-10-07 MED ORDER — MAGNESIUM SULFATE BOLUS VIA INFUSION
4.0000 g | Freq: Once | INTRAVENOUS | Status: AC
  Administered 2017-10-07: 4 g via INTRAVENOUS
  Filled 2017-10-07: qty 500

## 2017-10-07 MED ORDER — ONDANSETRON HCL 4 MG/2ML IJ SOLN: 4.0000 mg | Freq: Four times a day (QID) | INTRAMUSCULAR | Status: DC | PRN

## 2017-10-07 MED ORDER — MEASLES, MUMPS & RUBELLA VAC ~~LOC~~ INJ
0.5000 mL | INJECTION | Freq: Once | SUBCUTANEOUS | Status: DC
  Filled 2017-10-07: qty 0.5

## 2017-10-07 MED ORDER — DIPHENHYDRAMINE HCL 25 MG PO CAPS: 25.0000 mg | ORAL_CAPSULE | Freq: Four times a day (QID) | ORAL | Status: DC | PRN

## 2017-10-07 MED ORDER — ACETAMINOPHEN 325 MG PO TABS: 650.0000 mg | ORAL_TABLET | ORAL | Status: DC | PRN

## 2017-10-07 MED ORDER — OXYTOCIN 40 UNITS IN LACTATED RINGERS INFUSION - SIMPLE MED
2.5000 [IU]/h | INTRAVENOUS | Status: DC
  Administered 2017-10-07: 2.5 [IU]/h via INTRAVENOUS
  Filled 2017-10-07: qty 1000

## 2017-10-07 MED ORDER — SODIUM CHLORIDE 0.9 % IV SOLN
2.0000 g | Freq: Four times a day (QID) | INTRAVENOUS | Status: DC
  Filled 2017-10-07 (×2): qty 2000

## 2017-10-07 MED ORDER — SIMETHICONE 80 MG PO CHEW: 80.0000 mg | CHEWABLE_TABLET | ORAL | Status: DC | PRN

## 2017-10-07 MED ORDER — TETANUS-DIPHTH-ACELL PERTUSSIS 5-2.5-18.5 LF-MCG/0.5 IM SUSP
0.5000 mL | Freq: Once | INTRAMUSCULAR | Status: DC
  Filled 2017-10-07: qty 0.5

## 2017-10-07 MED ORDER — COCONUT OIL OIL
1.0000 "application " | TOPICAL_OIL | Status: DC | PRN
  Filled 2017-10-07: qty 120

## 2017-10-07 MED ORDER — LACTATED RINGERS IV SOLN
INTRAVENOUS | Status: DC
  Administered 2017-10-07 (×3): via INTRAVENOUS

## 2017-10-07 MED ORDER — OXYTOCIN BOLUS FROM INFUSION
500.0000 mL | Freq: Once | INTRAVENOUS | Status: AC
  Administered 2017-10-07: 500 mL via INTRAVENOUS

## 2017-10-07 MED ORDER — AZITHROMYCIN 500 MG IV SOLR
500.0000 mg | INTRAVENOUS | Status: DC
  Administered 2017-10-07: 500 mg via INTRAVENOUS
  Filled 2017-10-07: qty 500

## 2017-10-07 NOTE — MAU Provider Note (Signed)
History     CSN: [id number]  Arrival date and time: 10/07/17 [id number]   First Provider Initiated Contact with Patient 10/07/17 0355      Chief Complaint  Patient presents with  . Vaginal Bleeding  . Abdominal Pain   HPI  [name] is a 32 y.o. G1P0000 at 7547w5d who presents to MAU today with complaint of abdominal pain and vaginal bleeding. The patient states that the abdominal cramping will come in waves and then resolve to minimal aching in between episodes. She states pain has increased in frequency and severity since onset around 8pm-9pm last night. She states normal anatomy US on 09/29/17 with anterior placenta. No mention from the OB of previa or other concerns. [facility] and then [facility] bleeding so she called her OB office and was told to come here.   OB History    Gravida  1   Para  0   Term  0   Preterm  0   AB  0   Living  0     SAB  0   TAB  0   Ectopic  0   Multiple  0   Live Births              Past Medical History:  Diagnosis Date  . Medical history non-contributory     Past Surgical History:  Procedure Laterality Date  . NO PAST SURGERIES      Family History  Problem Relation Age of Onset  . Hypertension Father   . Hypertension Maternal Grandmother   . Diabetes Paternal Grandmother   . High Cholesterol Unknown   . Muscular dystrophy Maternal Uncle   . Cleft lip Paternal Aunt   . Muscular dystrophy Maternal Uncle   . Muscular dystrophy Cousin     Social History   Tobacco Use  . Smoking status: Never Smoker  Substance Use Topics  . Alcohol use: Yes    Alcohol/week: 0.6 oz    Types: 1 Cans of beer per week  . Drug use: No    Allergies:  Allergies  Allergen Reactions  . Sulfa Antibiotics Nausea Only    Medications Prior to Admission  Medication Sig Dispense Refill Last Dose  . Prenatal Vit-Fe Fumarate-FA (PRENATAL MULTIVITAMIN) TABS tablet Take 1 tablet by mouth daily at 12 noon.   10/06/2017 at  Unknown time  . norgestimate-ethinyl estradiol (ORTHO-CYCLEN,SPRINTEC,PREVIFEM) 0.25-35 MG-MCG tablet Take 1 tablet by mouth daily. 3 Package 4     Review of Systems  Constitutional: Negative for fever.  Gastrointestinal: Positive for abdominal pain. Negative for constipation, diarrhea, nausea and vomiting.  Genitourinary: Positive for vaginal bleeding. Negative for vaginal discharge.   Physical Exam   Blood pressure 118/65, pulse 80, temperature 98.7 F (37.1 C), resp. rate 17, height 5\' 2"  (1.575 m), weight 152 lb 4 oz (69.1 kg), last menstrual period 04/28/2017, SpO2 100 %.  Physical Exam  Nursing note and vitals reviewed. Constitutional: She is oriented to person, place, and time. She appears well-developed and well-nourished. No distress.  HENT:  Head: Normocephalic and atraumatic.  Cardiovascular: Normal rate.  Respiratory: Effort normal.  GI: Soft. She exhibits no distension and no mass. There is no tenderness. There is no rebound and no guarding.  Genitourinary:  Genitourinary Comments: Gentle speculum exam performed to confirm US results. Able to see membranes and visually dilated cervix ~ 3-4 cm.   Neurological: She is alert and oriented to person, place, and time.  Skin:  Skin is warm and dry. No erythema.  Psychiatric: She has a normal mood and affect.    Results for orders placed or performed during the hospital encounter of 10/07/17 (from the past 24 hour(s))  CBC with Differential/Platelet     Status: Abnormal   Collection Time: 10/07/17  4:15 AM  Result Value Ref Range   WBC 6.6 4.0 - 10.5 K/uL   RBC 3.82 (L) 3.87 - 5.11 MIL/uL   Hemoglobin 12.1 12.0 - 15.0 g/dL   HCT 96.0 (L) 45.4 - 09.8 %   MCV 90.3 78.0 - 100.0 fL   MCH 31.7 26.0 - 34.0 pg   MCHC 35.1 30.0 - 36.0 g/dL   RDW 11.9 14.7 - 82.9 %   Platelets 309 150 - 400 K/uL   Neutrophils Relative % 61 %   Neutro Abs 4.0 1.7 - 7.7 K/uL   Lymphocytes Relative 28 %   Lymphs Abs 1.9 0.7 - 4.0 K/uL    Monocytes Relative 9 %   Monocytes Absolute 0.6 0.1 - 1.0 K/uL   Eosinophils Relative 2 %   Eosinophils Absolute 0.2 0.0 - 0.7 K/uL   Basophils Relative 0 %   Basophils Absolute 0.0 0.0 - 0.1 K/uL    MAU Course  Procedures None  MDM Small bright red blood noted on pad Beside [place] ordered STAT to evaluate bleeding prior to complete SVE CBC and ABO/Rh ordered Preliminary [place] report shows hour-glassing membranes through the cervix which could be dilated up to 5 cm. Normal AFI and placenta.  Discussed patient with Dr. Marcelle Overlie. Admit to L&D with concern for delivery of 21 week fetus. Stadol for pain.   Assessment and Plan  A: SIUP at [redacted]w[redacted]d Inevitable second trimester abortion   P: Admit to L&D Stadol for pain Dr. Marcelle Overlie to see patient upon arrival    Vonzella Nipple, PA-C 10/07/2017, 4:42 AM

## 2017-10-07 NOTE — Progress Notes (Signed)
Pt was lying in bed and awake when I arrived. She had a host of visitors including her parents. Her husband was holding infant remains whom they name Stephenie AcresChristian Alexander. Pt and her mother were tearful but accepting. The family is a family of faith and feel blessed to have had this short time with [place]. We talked about how Ephriam KnucklesChristian has gone from her womb into the arms of God and those thoughts seemed comforting to the family. As family desired, we had prayer bedside of pt. I offered additional support as needed. Please page if the need arises. Chaplain Elmarie Shileyamela Montrel Donahoe KelleyHolder, [place]   10/07/17 1900  Clinical Encounter Type  Visited With Patient and family together

## 2017-10-07 NOTE — ED Triage Notes (Signed)
Pt reports abd cramping X1 day, pt states she thinks she is contracting. Pt is [redacted] weeks pregnant, EDD 02/12/18. Denies vaginal bleeding/vaginal DC. OB [facility] already called, they do not come see <23 wks. Pt also has not called her OB

## 2017-10-07 NOTE — H&P (Signed)
Carolyn Webb is a 32 y.o. female presenting for contractions>>>see MAU note for hx, in MAU, US showed hourglassed membranes(INTACT). OB History    Gravida  1   Para  0   Term  0   Preterm  0   AB  0   Living  0     SAB  0   TAB  0   Ectopic  0   Multiple  0   Live Births             Past Medical History:  Diagnosis Date  . Medical history non-contributory    Past Surgical History:  Procedure Laterality Date  . NO PAST SURGERIES     Family History: family history includes Cleft lip in her paternal aunt; Diabetes in her paternal grandmother; High Cholesterol in her unknown relative; Hypertension in her father and maternal grandmother; Muscular dystrophy in her cousin, maternal uncle, and maternal uncle. Social History:  reports that she has never smoked. She does not have any smokeless tobacco history on file. She reports that she drinks about 0.6 oz of alcohol per week. She reports that she does not use drugs.     Maternal Diabetes: No Genetic Screening: Normal Maternal Ultrasounds/Referrals: Normal Fetal Ultrasounds or other Referrals:  None Maternal Substance Abuse:  No Significant Maternal Medications:  None Significant Maternal Lab Results:  None Other Comments:  None  ROS History   Blood pressure (!) 110/52, pulse 65, temperature 98.5 F (36.9 C), temperature source Oral, resp. rate 18, height 5\' 2"  (1.575 m), weight 152 lb 4 oz (69.1 kg), last menstrual period 04/28/2017, SpO2 100 %. Exam Physical Exam  Constitutional: She appears well-developed and well-nourished.  HENT:  Head: Normocephalic and atraumatic.  Neck: Normal range of motion. Neck supple.  Cardiovascular: Normal rate and regular rhythm.  Respiratory: Effort normal and breath sounds normal.  GI:  Fundus @ umbilicus, nontender, FHR 136  Genitourinary:  Genitourinary Comments: Intact membranes but in upper 1/2 of vag with pt in [place]    Prenatal labs: ABO, Rh: --/--/A POS  (03/23 [id number]) Antibody: NEG (03/23 0415) Rubella:   RPR:    HBsAg:    HIV:    GBS:     Assessment/Plan: 10044w5d prob incompt cx w/ hourglassed membranes Will keep in T berg, start MgSO4 for tocolysis IV cefotan F/u MFM consult/US    Carolyn Webb 10/07/2017, 6:55 AM

## 2017-10-07 NOTE — ED Notes (Signed)
Per Epic [facility].

## 2017-10-07 NOTE — Plan of Care (Signed)
Patient and family spoke with Dr. Marcelle OverlieHolland this morning and understand the treatment plan. Patient is comfortable with the IV stadol that was ordered and given. Patient has no questions at this time, pt is resting and calm with family at the bedside. Pt knows how to notify nursing if any questions arise and feels comfortable communicating concerns to staff.

## 2017-10-07 NOTE — MAU Note (Signed)
Starting having intermittent cramps tonight that got worse around 0200.  Took 2 tylenol at 0200-didn't help [facility]  Started having vaginal bleeding while in the waiting room.  Called Dr's office and came here.

## 2017-10-08 ENCOUNTER — Encounter (HOSPITAL_COMMUNITY): Payer: Self-pay

## 2017-10-08 LAB — CBC
HCT: 33.6 % — ABNORMAL LOW (ref 36.0–46.0)
HEMOGLOBIN: 11.3 g/dL — AB (ref 12.0–15.0)
MCH: 30.9 pg (ref 26.0–34.0)
MCHC: 33.6 g/dL (ref 30.0–36.0)
MCV: 91.8 fL (ref 78.0–100.0)
Platelets: 292 10*3/uL (ref 150–400)
RBC: 3.66 MIL/uL — ABNORMAL LOW (ref 3.87–5.11)
RDW: 13.5 % (ref 11.5–15.5)
WBC: 13.4 10*3/uL — ABNORMAL HIGH (ref 4.0–10.5)

## 2017-10-08 MED ORDER — IBUPROFEN 800 MG PO TABS: 800.0000 mg | ORAL_TABLET | Freq: Three times a day (TID) | ORAL | 0 refills | Status: DC | PRN

## 2017-10-08 NOTE — Discharge Instructions (Signed)
Care After Vaginal Delivery Refer to this sheet in the next few weeks. These instructions provide you with information on caring for yourself after your delivery. Your health care provider may also give you more specific instructions. Your treatment has been planned according to current medical practices, but problems sometimes occur. Call your health care provider if you have any problems or questions after your delivery. What to expect after your delivery After your delivery, it is typical to have the following:  You may feel pain in the vaginal area for several days after delivery. If you had an incision or a vaginal tear, the area will probably continue to be tender to the touch for several weeks.  You may feel very fatigued after a vaginal delivery.  You may have vaginal bleeding and discharge that will start out red, then become pink, then yellow, then white. Altogether, this usually lasts for about 6 weeks.  The combination of having lost your baby and changing hormones from the delivery can make you feel very sad. You may also experience emotions that change very quickly. Some of the emotions people often notice after loss include: ? Anger. ? Denial. ? Guilt. ? Sorrow. ? Depression. ? Grief. ? Relationship problems.  Follow these instructions at home:  Consider seeking support for your loss. Some forms of support that you might consider include your religious leader, friends, family, a Pharmacist, hospital, or a bereavement support group.  Take medicines only as directed by your health care provider.  Continue to use good perineal care. Good perineal care includes: ? Wiping your perineum from front to back. ? Keeping your perineum clean.  Do not use tampons or douche until your health care provider says it is okay.  Shower, wash your hair, and take tub baths as directed by your health care provider.  Wear a well-fitting bra that provides breast support.  Drink enough  fluids to keep your urine clear or pale yellow.  Eat healthy foods.  Eat high-fiber foods every day, such as whole grain cereals and breads, brown rice, beans, and fresh fruits and vegetables. These foods may help prevent or relieve constipation.  Follow your health care provider's directions about resuming activities such as climbing stairs, driving, lifting, exercising, or traveling.  Increase your activities gradually.  Talk to your health care provider about resuming sexual activities. This depends on your risk of infection, your rate of healing, and your comfort and desire to resume sexual activity.  Try to have someone help you with your household activities for at least a few days after you leave the hospital.  Rest as much as possible.  Keep all of your scheduled postpartum appointments. It is very important to keep your scheduled follow-up appointments. At these appointments, your health care provider will be checking to make sure that you are healing physically and emotionally.  Do not drink alcohol, especially if you are taking medicine to relieve pain.  Do not use any tobacco products including cigarettes, chewing tobacco, or electronic cigarettes. If you need help quitting, ask your health care provider.  Do not use illegal drugs. Contact a health care provider if:  You feel sad or depressed.  You have thoughts of hurting yourself.  You are having trouble eating or sleeping.  You cannot enjoy the things in life you have previously enjoyed.  You are passing large clots from your vagina. Save any clots to show your health care provider.  You have a bad smelling discharge from your vagina.  You have trouble urinating.  You are urinating frequently.  You have pain when you urinate.  You have a change in your bowel movements.  You have increasing redness, pain, or swelling near your incision or vaginal tear.  You have pus draining from your incision or vaginal  tear.  Your incision or vaginal tear is separating.  You have painful, hard, or reddened breasts.  You have a severe headache.  You have blurred vision or see spots.  You are dizzy or light-headed.  You have a rash.  You have nausea or vomiting.  You have not had a menstrual period by the 12th week after delivery.  You have a fever. Get help right away if:  You are concerned that you may hurt yourself or you are considering suicide.  You have persistent pain.  You have chest pain.  You have shortness of breath.  You faint.  You have leg pain.  You have stomach pain.  Your vaginal bleeding saturates two or more sanitary pads in 1 hour.  This information is not intended to replace advice given to you by your health care provider. Make sure you discuss any questions you have with your health care provider.  Document Released: 11/18/2013 Document Revised: 12/10/2015 Document Reviewed: 08/22/2013 Elsevier Interactive Patient Education  2018 [employer]

## 2017-10-08 NOTE — Discharge Summary (Signed)
Obstetric Discharge Summary Reason for Admission: onset of labor Prenatal Procedures: none Intrapartum Procedures: spontaneous vaginal delivery Postpartum Procedures: none Complications-Operative and Postpartum: none Hemoglobin  Date Value Ref Range Status  10/08/2017 11.3 (L) 12.0 - 15.0 g/dL Final   HCT  Date Value Ref Range Status  10/08/2017 33.6 (L) 36.0 - 46.0 % Final    Physical Exam:  General: alert Lochia: appropriate Uterine Fundus: firm Incision: healing well DVT Evaluation: No evidence of DVT seen on physical exam.  Discharge Diagnoses: 21+ week IUP,incompt cx>>labor  Discharge Information: Date: 10/08/2017 Activity: pelvic rest Diet: routine Medications: PNV and Ibuprofen Condition: stable Instructions: refer to practice specific booklet Discharge to: home Follow-up Information    Carolyn Webb, Carolyn Jennifer, MD Follow up.   Specialty:  Obstetrics and Gynecology Why:  has appt 5/2 Contact information: [address] [place] [id number] [phone]           Newborn Data: Live born female  Birth Weight: 14.5 oz (411 g) APGAR: 1, 0  Newborn Delivery   Birth date/time:  10/07/2017 17:50:00 Delivery type:  Vaginal, Spontaneous      Carolyn Webb Carolyn Webb 10/08/2017, 8:02 AM

## 2017-10-26 ENCOUNTER — Encounter (HOSPITAL_COMMUNITY): Payer: Self-pay

## 2017-10-26 ENCOUNTER — Inpatient Hospital Stay (HOSPITAL_COMMUNITY)
Admission: AD | Admit: 2017-10-26 | Discharge: 2017-10-26 | Disposition: A | Discharge status: Home / Self Care | Payer: BLUE CROSS/BLUE SHIELD | Source: Ambulatory Visit | Attending: Obstetrics and Gynecology | Admitting: Obstetrics and Gynecology

## 2017-10-26 ENCOUNTER — Inpatient Hospital Stay (HOSPITAL_COMMUNITY): Payer: BLUE CROSS/BLUE SHIELD

## 2017-10-26 DIAGNOSIS — IMO0002 Reserved for concepts with insufficient information to code with codable children: Secondary | ICD-10-CM

## 2017-10-26 DIAGNOSIS — R1030 Lower abdominal pain, unspecified: Secondary | ICD-10-CM

## 2017-10-26 DIAGNOSIS — R109 Unspecified abdominal pain: Secondary | ICD-10-CM

## 2017-10-26 LAB — URINALYSIS, ROUTINE W REFLEX MICROSCOPIC
BACTERIA UA: NONE SEEN
Bilirubin Urine: NEGATIVE
Glucose, UA: NEGATIVE mg/dL
Ketones, ur: NEGATIVE mg/dL
Leukocytes, UA: NEGATIVE
Nitrite: NEGATIVE
PROTEIN: NEGATIVE mg/dL
Specific Gravity, Urine: 1.019 (ref 1.005–1.030)
pH: 6 (ref 5.0–8.0)

## 2017-10-26 LAB — CBC
HEMATOCRIT: 44 % (ref 36.0–46.0)
Hemoglobin: 14.9 g/dL (ref 12.0–15.0)
MCH: 31.2 pg (ref 26.0–34.0)
MCHC: 33.9 g/dL (ref 30.0–36.0)
MCV: 92.1 fL (ref 78.0–100.0)
PLATELETS: 381 10*3/uL (ref 150–400)
RBC: 4.78 MIL/uL (ref 3.87–5.11)
RDW: 13 % (ref 11.5–15.5)
WBC: 4.7 10*3/uL (ref 4.0–10.5)

## 2017-10-26 LAB — HCG, QUANTITATIVE, PREGNANCY: hCG, Beta Chain, Quant, S: 11 m[IU]/mL — ABNORMAL HIGH (ref <5)

## 2017-10-26 MED ORDER — LACTATED RINGERS IV SOLN
INTRAVENOUS | Status: DC
  Administered 2017-10-26 (×2): via INTRAVENOUS

## 2017-10-26 MED ORDER — AMOXICILLIN-POT CLAVULANATE 875-125 MG PO TABS: 1.0000 | ORAL_TABLET | Freq: Two times a day (BID) | ORAL | Status: DC

## 2017-10-26 MED ORDER — HYDROMORPHONE HCL 1 MG/ML IJ SOLN
0.5000 mg | Freq: Once | INTRAMUSCULAR | Status: AC
  Administered 2017-10-26: 0.5 mg via INTRAVENOUS
  Filled 2017-10-26: qty 1

## 2017-10-26 MED ORDER — SODIUM CHLORIDE 0.9 % IV SOLN
3.0000 g | Freq: Once | INTRAVENOUS | Status: AC
Start: 2017-10-26 — End: 2017-10-26
  Administered 2017-10-26: 3 g via INTRAVENOUS
  Filled 2017-10-26: qty 3

## 2017-10-26 MED ORDER — AMOXICILLIN-POT CLAVULANATE 875-125 MG PO TABS
1.0000 | ORAL_TABLET | Freq: Two times a day (BID) | ORAL | 0 refills | Status: DC
Start: 2017-10-26 — End: 2018-09-14

## 2017-10-26 MED ORDER — PROMETHAZINE HCL 25 MG/ML IJ SOLN
12.5000 mg | Freq: Once | INTRAMUSCULAR | Status: AC
  Administered 2017-10-26: 12.5 mg via INTRAVENOUS
  Filled 2017-10-26: qty 1

## 2017-10-26 MED ORDER — OXYCODONE-ACETAMINOPHEN 5-325 MG PO TABS: 1.0000 | ORAL_TABLET | Freq: Four times a day (QID) | ORAL | Status: DC | PRN

## 2017-10-26 MED ORDER — OXYCODONE-ACETAMINOPHEN 5-325 MG PO TABS: 1.0000 | ORAL_TABLET | Freq: Four times a day (QID) | ORAL | 0 refills | Status: DC | PRN

## 2017-10-26 MED ORDER — HYDROMORPHONE HCL 1 MG/ML IJ SOLN
1.0000 mg | Freq: Once | INTRAMUSCULAR | Status: AC
  Administered 2017-10-26: 1 mg via INTRAVENOUS
  Filled 2017-10-26: qty 1

## 2017-10-26 NOTE — Progress Notes (Addendum)
Dr. Marcelle OverlieHolland notified of VB.  MD states pt may go home, she's to keep appt scheduled on Monday, April 15th & call office or return to MAU if VB becomes heavy, saturating pads.

## 2017-10-26 NOTE — MAU Provider Note (Signed)
History     CSN: [id number]  Arrival date and time: 10/26/17 0450   First Provider Initiated Contact with Patient 10/26/17 0453      Chief Complaint  Patient presents with  . Abdominal Pain   HPI  Carolyn Webb is a 32 y.o. G59P0101 female who presents with abdominal pain. Woke up this morning with sudden onset lower abdominal pain. Rates pain 10/10 & describes as sharp cramping. Pain is constant. Pain worse when lying supine & with position changes. Passing flatus does not improve pain. Reports a normal BM yesterday. She had a 21 wk delivery on 3/23 d/t cervical insufficiency. Denies fever/chills. Some vaginal spotting today. Had anal intercourse yesterday; otherwise no vaginal intercourse since her delivery. Denies n/v/d, constipation. .  OB History    Gravida  1   Para  1   Term  0   Preterm  1   AB  0   Living  1     SAB  0   TAB  0   Ectopic  0   Multiple  0   Live Births  1           Past Medical History:  Diagnosis Date  . Medical history non-contributory     Past Surgical History:  Procedure Laterality Date  . NO PAST SURGERIES      Family History  Problem Relation Age of Onset  . Hypertension Father   . Hypertension Maternal Grandmother   . Diabetes Paternal Grandmother   . High Cholesterol Unknown   . Muscular dystrophy Maternal Uncle   . Cleft lip Paternal Aunt   . Muscular dystrophy Maternal Uncle   . Muscular dystrophy Cousin     Social History   Tobacco Use  . Smoking status: Never Smoker  Substance Use Topics  . Alcohol use: Yes    Alcohol/week: 0.6 oz    Types: 1 Cans of beer per week  . Drug use: No    Allergies:  Allergies  Allergen Reactions  . Sulfa Antibiotics Nausea Only    Medications Prior to Admission  Medication Sig Dispense Refill Last Dose  . ibuprofen (ADVIL,MOTRIN) 800 MG tablet Take 1 tablet (800 mg total) by mouth every 8 (eight) hours as needed for moderate pain. 30 tablet 0   . Prenatal Vit-Fe  Fumarate-FA (PRENATAL MULTIVITAMIN) TABS tablet Take 1 tablet by mouth daily at 12 noon.   10/06/2017 at Unknown time    Review of Systems  Constitutional: Negative.   Gastrointestinal: Positive for abdominal pain. Negative for constipation, diarrhea, nausea and vomiting.  Genitourinary: Positive for vaginal bleeding.   Physical Exam   Blood pressure 136/79, pulse 63, temperature 97.8 F (36.6 C), temperature source Oral, resp. rate 20, unknown if currently breastfeeding.  Physical Exam  Nursing note and vitals reviewed. Constitutional: She is oriented to person, place, and time. She appears well-developed and well-nourished. She appears distressed (pt moaning & rocking back & forth in bed).  HENT:  Head: Normocephalic and atraumatic.  Eyes: Conjunctivae are normal. Right eye exhibits no discharge. Left eye exhibits no discharge. No scleral icterus.  Neck: Normal range of motion.  Cardiovascular: Normal rate, regular rhythm and normal heart sounds.  No murmur heard. Respiratory: Effort normal and breath sounds normal. No respiratory distress. She has no wheezes.  GI: Soft. There is generalized tenderness (generalized TTP, worse in RLQ). There is guarding. There is no rigidity, no rebound and no CVA tenderness.  Neurological: She is alert and oriented  to person, place, and time.  Skin: Skin is warm and dry. She is not diaphoretic.  Psychiatric: She has a normal mood and affect. Her behavior is normal. Judgment and thought content normal.    MAU Course  Procedures Results for orders placed or performed during the hospital encounter of 10/26/17 (from the past 24 hour(s))  CBC     Status: None   Collection Time: 10/26/17  5:00 AM  Result Value Ref Range   WBC 4.7 4.0 - 10.5 K/uL   RBC 4.78 3.87 - 5.11 MIL/uL   Hemoglobin 14.9 12.0 - 15.0 g/dL   HCT 16.144.0 09.636.0 - 04.546.0 %   MCV 92.1 78.0 - 100.0 fL   MCH 31.2 26.0 - 34.0 pg   MCHC 33.9 30.0 - 36.0 g/dL   RDW 40.913.0 81.111.5 - 91.415.5 %    Platelets 381 150 - 400 K/uL  hCG, quantitative, pregnancy     Status: Abnormal   Collection Time: 10/26/17  5:00 AM  Result Value Ref Range   hCG, Beta Chain, Quant, S 11 (H) <5 mIU/mL  Urinalysis, Routine w reflex microscopic     Status: Abnormal   Collection Time: 10/26/17  6:00 AM  Result Value Ref Range   Color, Urine YELLOW YELLOW   APPearance CLEAR CLEAR   Specific Gravity, Urine 1.019 1.005 - 1.030   pH 6.0 5.0 - 8.0   Glucose, UA NEGATIVE NEGATIVE mg/dL   Hgb urine dipstick MODERATE (A) NEGATIVE   Bilirubin Urine NEGATIVE NEGATIVE   Ketones, ur NEGATIVE NEGATIVE mg/dL   Protein, ur NEGATIVE NEGATIVE mg/dL   Nitrite NEGATIVE NEGATIVE   Leukocytes, UA NEGATIVE NEGATIVE   RBC / HPF 0-5 0 - 5 RBC/hpf   WBC, UA 0-5 0 - 5 WBC/hpf   Bacteria, UA NONE SEEN NONE SEEN   Squamous Epithelial / LPF 0-5 (A) NONE SEEN   Mucus PRESENT    [place] Pelvis (transabdominal Only)  Result Date: 10/26/2017 CLINICAL DATA:  Initial evaluation for acute severe abdominal pain, recent delivery on 10/07/2017. EXAM: TRANSABDOMINAL ULTRASOUND OF PELVIS TECHNIQUE: Transabdominal ultrasound examination of the pelvis was performed including evaluation of the uterus, ovaries, adnexal regions, and pelvic cul-de-sac. Both transabdominal and transvaginal ultrasound examinations of the pelvis were performed. Transabdominal technique was performed for global imaging of the pelvis including uterus, ovaries, adnexal regions, and pelvic cul-de-sac. It was necessary to proceed with endovaginal exam following the transabdominal exam to visualize the uterus, endometrium, and ovaries. COMPARISON:  None available. FINDINGS: Uterus Measurements: 9.6 x 5.6 x 8.1 cm. No fibroids or other mass visualized. Endometrium Thickness: 14.2 mm. Endometrial stripe is somewhat heterogeneous and poorly defined. Few possible internal areas of associated vascularity noted. Findings raise the possibility for retained products. Right ovary  Measurements: 3.5 x 1.6 x 2.0 cm. Normal appearance/no adnexal mass. Left ovary Measurements: 2.9 x 1.7 x 2.6 cm. Normal appearance/no adnexal mass. Other findings No abnormal free fluid. IMPRESSION: 1. Thickened and somewhat poorly defined endometrial stripe measuring up to 14 mm with heterogeneous echotexture, with question of a few scattered internal areas of vascularity. While these findings may be related to recent pregnancy, possible retained products of conception could be considered in the correct clinical setting. 2. No other acute abnormality within the pelvis. Electronically Signed   By: Rise MuBenjamin  McClintock M.D.   On: 10/26/2017 06:48     MDM Exam difficult d/t patient's pain. IV started & dilaudid 1 mg given. Ultrasound to bedside for exam.  Pt reports some improvement  in pain but still very painful if moves & continued TTP.  VSS, pt afebrile, no leukocytosis & Hgb stable at 14.9 Discussed patient with Dr. Renaldo Fiddler. Will keep patient NPO until Dr. Renaldo Fiddler or Dr. Marcelle Overlie comes to see her Assessment and Plan  A:  1. Retained products of conception without hemorrhage   2. Abdominal pain   3. Lower abdominal pain    P: Dr. Renaldo Fiddler on unit to see patient Keep pt NPO Additional dose of dilaudid given for pain control  Judeth Horn 10/26/2017, 4:53 AM

## 2017-10-26 NOTE — Progress Notes (Signed)
Pt discharged & preparing to leave, noted she's having small amount bright red VB.  Informed will call Dr. Marcelle OverlieHolland.

## 2017-10-26 NOTE — Progress Notes (Signed)
Reviewed MAU notes and US, I attended this 21 week del and placenta came out readily and looked intact, US shows 1.4 cm?clot?POC, which is minimal, and she is having NO signif bleeding, she is mod tender to palpation at the fundus, although afeb + nl WBC  Will Rx for presumed endometritis, IV Unasyn X 1 here, then D/C on PO ABX + PO pain meds, F/U in office Monday BP (!) 97/57 (BP Location: Left Arm)   Pulse 82   Temp 98.5 F (36.9 C) (Oral)   Resp 20   CBC    Component Value Date/Time   WBC 4.7 10/26/2017 0500   RBC 4.78 10/26/2017 0500   HGB 14.9 10/26/2017 0500   HCT 44.0 10/26/2017 0500   PLT 381 10/26/2017 0500   MCV 92.1 10/26/2017 0500   MCH 31.2 10/26/2017 0500   MCHC 33.9 10/26/2017 0500   RDW 13.0 10/26/2017 0500   LYMPHSABS 1.9 10/07/2017 0415   MONOABS 0.6 10/07/2017 0415   EOSABS 0.2 10/07/2017 0415   BASOSABS 0.0 10/07/2017 0415

## 2017-10-26 NOTE — Discharge Instructions (Signed)
Call if incr pain or bleeding, Temp >101

## 2017-10-26 NOTE — H&P (Signed)
Carolyn Webb is a 32 y.o. female s/p 21 week SVD on 10/07/17 presenting for sudden onset of lower abdominal/pelvic pain.  Pain awoke her from sleep.  Denies n/v/d.  Denies f/c.  Reports minimal dark vaginal discharge until this morning when it appears more red.  No heavy vb.  + flatus and normal BM.  No hematuria or dysuria.  No vaginal intercourse since delivery  OB History    Gravida  1   Para  1   Term  0   Preterm  1   AB  0   Living  1     SAB  0   TAB  0   Ectopic  0   Multiple  0   Live Births  1          Past Medical History:  Diagnosis Date  . Medical history non-contributory    Past Surgical History:  Procedure Laterality Date  . NO PAST SURGERIES     Family History: family history includes Cleft lip in her paternal aunt; Diabetes in her paternal grandmother; High Cholesterol in her unknown relative; Hypertension in her father and maternal grandmother; Muscular dystrophy in her cousin, maternal uncle, and maternal uncle. Social History:  reports that she has never smoked. She does not have any smokeless tobacco history on file. She reports that she drinks about 0.6 oz of alcohol per week. She reports that she does not use drugs.   Exam Physical Exam  Gen - NAD Abd - very tender bilateral LQ and midline. +gaurding. No rebound. No CVAT Ext - NT  Prenatal labs: ABO, Rh: --/--/A POS, A POS Performed at [facility], [address] [place], [place] [address]  [phone]) Antibody: NEG (03/23 0415)   Results for orders placed or performed during the hospital encounter of 10/26/17 (from the past 24 hour(s))  CBC     Status: None   Collection Time: 10/26/17  5:00 AM  Result Value Ref Range   WBC 4.7 4.0 - 10.5 K/uL   RBC 4.78 3.87 - 5.11 MIL/uL   Hemoglobin 14.9 12.0 - 15.0 g/dL   HCT 11.9 14.7 - 82.9 %   MCV 92.1 78.0 - 100.0 fL   MCH 31.2 26.0 - 34.0 pg   MCHC 33.9 30.0 - 36.0 g/dL   RDW 56.2 13.0 - 86.5 %   Platelets 381 150 - 400 K/uL   hCG, quantitative, pregnancy     Status: Abnormal   Collection Time: 10/26/17  5:00 AM  Result Value Ref Range   hCG, Beta Chain, Quant, S 11 (H) <5 mIU/mL  Urinalysis, Routine w reflex microscopic     Status: Abnormal   Collection Time: 10/26/17  6:00 AM  Result Value Ref Range   Color, Urine YELLOW YELLOW   APPearance CLEAR CLEAR   Specific Gravity, Urine 1.019 1.005 - 1.030   pH 6.0 5.0 - 8.0   Glucose, UA NEGATIVE NEGATIVE mg/dL   Hgb urine dipstick MODERATE (A) NEGATIVE   Bilirubin Urine NEGATIVE NEGATIVE   Ketones, ur NEGATIVE NEGATIVE mg/dL   Protein, ur NEGATIVE NEGATIVE mg/dL   Nitrite NEGATIVE NEGATIVE   Leukocytes, UA NEGATIVE NEGATIVE   RBC / HPF 0-5 0 - 5 RBC/hpf   WBC, UA 0-5 0 - 5 WBC/hpf   Bacteria, UA NONE SEEN NONE SEEN   Squamous Epithelial / LPF 0-5 (A) NONE SEEN   Mucus PRESENT    [place]:  14mm EMS with few poorly defined areas of increased  vascularity.  Possible retained POC.  No adnexal masses or free fluid   Assessment/Plan: Pelvic pain and retained POC Recommend D&c  Zelphia CairoGretchen Phoenyx Melka 10/26/2017, 7:29 AM

## 2017-10-26 NOTE — MAU Note (Signed)
Woke up in middle of night w/ severe cramping in lower abdomen. Some bleeding. 3 weeks post partum 21/5 wk loss.  Denies n/v/d.  Rating pain 10/10.

## 2018-09-06 LAB — OB RESULTS CONSOLE RPR: RPR: NONREACTIVE

## 2018-09-06 LAB — OB RESULTS CONSOLE HEPATITIS B SURFACE ANTIGEN: Hepatitis B Surface Ag: NEGATIVE

## 2018-09-06 LAB — OB RESULTS CONSOLE HIV ANTIBODY (ROUTINE TESTING): HIV: NONREACTIVE

## 2018-09-14 ENCOUNTER — Encounter (HOSPITAL_COMMUNITY): Payer: Self-pay | Admitting: *Deleted

## 2018-09-14 ENCOUNTER — Telehealth (HOSPITAL_COMMUNITY): Payer: Self-pay | Admitting: *Deleted

## 2018-09-18 ENCOUNTER — Telehealth (HOSPITAL_COMMUNITY): Payer: Self-pay | Admitting: *Deleted

## 2018-09-18 ENCOUNTER — Encounter (HOSPITAL_COMMUNITY): Payer: Self-pay

## 2018-09-18 NOTE — Telephone Encounter (Signed)
Preadmission screen  

## 2018-09-19 ENCOUNTER — Other Ambulatory Visit: Payer: Self-pay | Admitting: Obstetrics and Gynecology

## 2018-09-19 NOTE — H&P (Signed)
33 y.o. E3P2951 [redacted]w[redacted]d for cervical cerclage for hx of incompetent cervix. She had a normal NT and first tri screen.  Pt is A+..  Past Medical History:  Diagnosis Date  . Medical history non-contributory    Past Surgical History:  Procedure Laterality Date  . NO PAST SURGERIES      Social History   Socioeconomic History  . Marital status: Married    Spouse name: Not on file  . Number of children: Not on file  . Years of education: Not on file  . Highest education level: Not on file  Occupational History  . Not on file  Social Needs  . Financial resource strain: Not hard at all  . Food insecurity:    Worry: Never true    Inability: Never true  . Transportation needs:    Medical: No    Non-medical: Not on file  Tobacco Use  . Smoking status: Never Smoker  . Smokeless tobacco: Never Used  Substance and Sexual Activity  . Alcohol use: Yes    Alcohol/week: 1.0 standard drinks    Types: 1 Cans of beer per week  . Drug use: No  . Sexual activity: Yes    Comment: INTERCOURSE AGE 22, SEXUAL PARTNERS MORE THAN 5  Lifestyle  . Physical activity:    Days per week: Not on file    Minutes per session: Not on file  . Stress: To some extent  Relationships  . Social connections:    Talks on phone: Not on file    Gets together: Not on file    Attends religious service: Not on file    Active member of club or organization: Not on file    Attends meetings of clubs or organizations: Not on file    Relationship status: Not on file  . Intimate partner violence:    Fear of current or ex partner: No    Emotionally abused: No    Physically abused: No    Forced sexual activity: No  Other Topics Concern  . Not on file  Social History Narrative  . Not on file    No current facility-administered medications on file prior to encounter.    Current Outpatient Medications on File Prior to Encounter  Medication Sig Dispense Refill  . Prenatal Vit-Fe Fumarate-FA (PRENATAL PO) Take 1  tablet by mouth daily.      Allergies  Allergen Reactions  . Sulfa Antibiotics Nausea Only    There were no vitals filed for this visit.  Lungs: clear to ascultation Cor:  RRR Abdomen:  soft, nontender, nondistended.  FHTs present. Ex:  no cords, erythema Pelvic:  Normal cervical length and closed.  A:  For modified shirodkar cerclage.    P: P: All risks, benefits and alternatives d/w patient and she desires to proceed.  Patient will have SCDs during the operation.   Pt will go home next day if eating, ambulating, voiding and pain control is good.  Loney Laurence

## 2018-09-20 ENCOUNTER — Encounter (HOSPITAL_COMMUNITY)
Admission: RE | Admit: 2018-09-20 | Discharge: 2018-09-20 | Disposition: A | Discharge status: Home / Self Care | Payer: 59 | Source: Ambulatory Visit

## 2018-09-21 ENCOUNTER — Encounter (HOSPITAL_COMMUNITY): Payer: Self-pay | Admitting: Anesthesiology

## 2018-09-21 ENCOUNTER — Observation Stay (HOSPITAL_COMMUNITY)
Admission: RE | Admit: 2018-09-21 | Discharge: 2018-09-22 | Disposition: A | Discharge status: Home / Self Care | Payer: 59 | Attending: Obstetrics and Gynecology | Admitting: Obstetrics and Gynecology

## 2018-09-21 ENCOUNTER — Ambulatory Visit (HOSPITAL_COMMUNITY): Payer: 59 | Admitting: Certified Registered Nurse Anesthetist

## 2018-09-21 ENCOUNTER — Other Ambulatory Visit: Payer: Self-pay

## 2018-09-21 ENCOUNTER — Encounter (HOSPITAL_COMMUNITY)
Admission: RE | Disposition: A | Discharge status: Home / Self Care | Payer: Self-pay | Source: Home / Self Care | Attending: Obstetrics and Gynecology

## 2018-09-21 DIAGNOSIS — Z9889 Other specified postprocedural states: Secondary | ICD-10-CM

## 2018-09-21 DIAGNOSIS — Z3A14 14 weeks gestation of pregnancy: Secondary | ICD-10-CM | POA: Diagnosis not present

## 2018-09-21 DIAGNOSIS — Z882 Allergy status to sulfonamides status: Secondary | ICD-10-CM | POA: Diagnosis not present

## 2018-09-21 DIAGNOSIS — O3432 Maternal care for cervical incompetence, second trimester: Secondary | ICD-10-CM | POA: Diagnosis not present

## 2018-09-21 HISTORY — PX: CERVICAL CERCLAGE: SHX1329

## 2018-09-21 LAB — CBC
HEMATOCRIT: 39.8 % (ref 36.0–46.0)
Hemoglobin: 13 g/dL (ref 12.0–15.0)
MCH: 30.7 pg (ref 26.0–34.0)
MCHC: 32.7 g/dL (ref 30.0–36.0)
MCV: 93.9 fL (ref 80.0–100.0)
Platelets: 312 10*3/uL (ref 150–400)
RBC: 4.24 MIL/uL (ref 3.87–5.11)
RDW: 12.7 % (ref 11.5–15.5)
WBC: 5.5 10*3/uL (ref 4.0–10.5)
nRBC: 0 % (ref 0.0–0.2)

## 2018-09-21 SURGERY — CERCLAGE, CERVIX, VAGINAL APPROACH
Anesthesia: Spinal

## 2018-09-21 MED ORDER — ONDANSETRON HCL 4 MG PO TABS: 4.0000 mg | ORAL_TABLET | Freq: Four times a day (QID) | ORAL | Status: DC | PRN

## 2018-09-21 MED ORDER — OXYCODONE-ACETAMINOPHEN 5-325 MG PO TABS
1.0000 | ORAL_TABLET | ORAL | Status: DC | PRN
  Administered 2018-09-21: 1 via ORAL
  Filled 2018-09-21: qty 1

## 2018-09-21 MED ORDER — ONDANSETRON HCL 4 MG/2ML IJ SOLN: 4.0000 mg | Freq: Four times a day (QID) | INTRAMUSCULAR | Status: DC | PRN

## 2018-09-21 MED ORDER — MENTHOL 3 MG MT LOZG: 1.0000 | LOZENGE | OROMUCOSAL | Status: DC | PRN

## 2018-09-21 MED ORDER — GABAPENTIN 300 MG PO CAPS
300.0000 mg | ORAL_CAPSULE | ORAL | Status: AC
  Administered 2018-09-21: 300 mg via ORAL

## 2018-09-21 MED ORDER — SOD CITRATE-CITRIC ACID 500-334 MG/5ML PO SOLN
ORAL | Status: AC
  Filled 2018-09-21: qty 15

## 2018-09-21 MED ORDER — SODIUM CHLORIDE 0.9 % IR SOLN
Status: DC | PRN
  Administered 2018-09-21: 1

## 2018-09-21 MED ORDER — LACTATED RINGERS IV SOLN
INTRAVENOUS | Status: DC
  Administered 2018-09-21 (×2): via INTRAVENOUS

## 2018-09-21 MED ORDER — PHENYLEPHRINE 40 MCG/ML (10ML) SYRINGE FOR IV PUSH (FOR BLOOD PRESSURE SUPPORT)
PREFILLED_SYRINGE | INTRAVENOUS | Status: AC
  Filled 2018-09-21: qty 10

## 2018-09-21 MED ORDER — LIDOCAINE-EPINEPHRINE (PF) 2 %-1:200000 IJ SOLN
INTRAMUSCULAR | Status: AC
  Filled 2018-09-21: qty 10

## 2018-09-21 MED ORDER — ONDANSETRON HCL 4 MG/2ML IJ SOLN: 4.0000 mg | Freq: Once | INTRAMUSCULAR | Status: DC | PRN

## 2018-09-21 MED ORDER — PHENYLEPHRINE HCL 10 MG/ML IJ SOLN
INTRAMUSCULAR | Status: DC | PRN
  Administered 2018-09-21: 80 ug via INTRAVENOUS

## 2018-09-21 MED ORDER — MEPERIDINE HCL 25 MG/ML IJ SOLN: 6.2500 mg | INTRAMUSCULAR | Status: DC | PRN

## 2018-09-21 MED ORDER — FENTANYL CITRATE (PF) 100 MCG/2ML IJ SOLN: 25.0000 ug | INTRAMUSCULAR | Status: DC | PRN

## 2018-09-21 MED ORDER — GABAPENTIN 300 MG PO CAPS
ORAL_CAPSULE | ORAL | Status: AC
  Filled 2018-09-21: qty 1

## 2018-09-21 MED ORDER — ACETAMINOPHEN 500 MG PO TABS
1000.0000 mg | ORAL_TABLET | ORAL | Status: AC
  Administered 2018-09-21: 1000 mg via ORAL

## 2018-09-21 MED ORDER — BUPIVACAINE IN DEXTROSE 0.75-8.25 % IT SOLN
INTRATHECAL | Status: DC | PRN
  Administered 2018-09-21: 1 mL via INTRATHECAL

## 2018-09-21 MED ORDER — SOD CITRATE-CITRIC ACID 500-334 MG/5ML PO SOLN
30.0000 mL | ORAL | Status: AC
  Administered 2018-09-21: 30 mL via ORAL

## 2018-09-21 MED ORDER — LIDOCAINE-EPINEPHRINE 2 %-1:100000 IJ SOLN
INTRAMUSCULAR | Status: DC | PRN
  Administered 2018-09-21: 2 mL

## 2018-09-21 MED ORDER — ACETAMINOPHEN 500 MG PO TABS
ORAL_TABLET | ORAL | Status: AC
  Filled 2018-09-21: qty 2

## 2018-09-21 MED ORDER — ACETAMINOPHEN 500 MG PO TABS
1000.0000 mg | ORAL_TABLET | Freq: Four times a day (QID) | ORAL | Status: DC
  Administered 2018-09-21 – 2018-09-22 (×3): 1000 mg via ORAL
  Filled 2018-09-21 (×4): qty 2

## 2018-09-21 SURGICAL SUPPLY — 24 items
BLADE SURG 11 STRL SS (BLADE) ×3 IMPLANT
CANISTER SUCT 3000ML PPV (MISCELLANEOUS) ×3 IMPLANT
CATH FOLEY 2WAY SLVR 30CC 16FR (CATHETERS) IMPLANT
ELECT REM PT RETURN 9FT ADLT (ELECTROSURGICAL) ×3
ELECTRODE REM PT RTRN 9FT ADLT (ELECTROSURGICAL) ×1 IMPLANT
GLOVE BIO SURGEON STRL SZ7 (GLOVE) ×3 IMPLANT
GLOVE BIOGEL PI IND STRL 7.0 (GLOVE) ×1 IMPLANT
GLOVE BIOGEL PI INDICATOR 7.0 (GLOVE) ×2
GOWN STRL REUS W/TWL LRG LVL3 (GOWN DISPOSABLE) ×6 IMPLANT
NEEDLE MAYO CATGUT SZ4 (NEEDLE) ×3 IMPLANT
NS IRRIG 1000ML POUR BTL (IV SOLUTION) ×3 IMPLANT
PACK VAGINAL MINOR WOMEN LF (CUSTOM PROCEDURE TRAY) ×3 IMPLANT
PAD OB MATERNITY 4.3X12.25 (PERSONAL CARE ITEMS) ×3 IMPLANT
PAD PREP 24X48 CUFFED NSTRL (MISCELLANEOUS) ×3 IMPLANT
PENCIL BUTTON HOLSTER BLD 10FT (ELECTRODE) ×3 IMPLANT
SUT TICRON 2 BLUE 36 GS-21 (SUTURE) ×9 IMPLANT
SUT VIC AB 2-0 CT1 27 (SUTURE) ×2
SUT VIC AB 2-0 CT1 TAPERPNT 27 (SUTURE) ×1 IMPLANT
SYR 30ML LL (SYRINGE) IMPLANT
TOWEL OR 17X24 6PK STRL BLUE (TOWEL DISPOSABLE) ×6 IMPLANT
TRAY FOLEY W/BAG SLVR 14FR (SET/KITS/TRAYS/PACK) ×3 IMPLANT
TUBING NON-CON 1/4 X 20 CONN (TUBING) ×2 IMPLANT
TUBING NON-CON 1/4 X 20' CONN (TUBING) ×1
YANKAUER SUCT BULB TIP NO VENT (SUCTIONS) ×3 IMPLANT

## 2018-09-21 NOTE — Progress Notes (Signed)
There has been no change in the patients history, status or exam since the history and physical.  Vitals:   09/21/18 0838 09/21/18 0843  BP: 108/62   Pulse: 78   Resp: 18   Temp: 98.5 F (36.9 C)   TempSrc: Oral   Weight:  63 kg  Height:  5\' 2"  (1.575 m)    No results found for this or any previous visit (from the past 72 hour(s)).  Loney Laurence

## 2018-09-21 NOTE — Addendum Note (Signed)
Addendum  created 09/21/18 1543 by Elgie Congo, CRNA   Clinical Note Signed

## 2018-09-21 NOTE — Anesthesia Preprocedure Evaluation (Signed)
Anesthesia Evaluation  Patient identified by MRN, date of birth, ID band Patient awake    Reviewed: Allergy & Precautions, H&P , NPO status , Patient's Chart, lab work & pertinent test results  Airway Mallampati: I  TM Distance: >3 FB Neck ROM: full    Dental no notable dental hx. (+) Teeth Intact   Pulmonary neg pulmonary ROS,    Pulmonary exam normal breath sounds clear to auscultation       Cardiovascular negative cardio ROS Normal cardiovascular exam Rhythm:regular Rate:Normal     Neuro/Psych negative neurological ROS  negative psych ROS   GI/Hepatic negative GI ROS, Neg liver ROS,   Endo/Other  negative endocrine ROS  Renal/GU negative Renal ROS  negative genitourinary   Musculoskeletal negative musculoskeletal ROS (+)   Abdominal Normal abdominal exam  (+)   Peds  Hematology negative hematology ROS (+)   Anesthesia Other Findings   Reproductive/Obstetrics (+) Pregnancy                             Anesthesia Physical Anesthesia Plan  ASA: II  Anesthesia Plan: Spinal   Post-op Pain Management:    Induction:   PONV Risk Score and Plan: 2 and Treatment may vary due to age or medical condition  Airway Management Planned:   Additional Equipment:   Intra-op Plan:   Post-operative Plan:   Informed Consent: I have reviewed the patients History and Physical, chart, labs and discussed the procedure including the risks, benefits and alternatives for the proposed anesthesia with the patient or authorized representative who has indicated his/her understanding and acceptance.       Plan Discussed with: CRNA  Anesthesia Plan Comments:         Anesthesia Quick Evaluation

## 2018-09-21 NOTE — Brief Op Note (Signed)
09/21/2018  11:26 AM  PATIENT:  Carolyn Webb  33 y.o. female  PRE-OPERATIVE DIAGNOSIS:  history of INCOMPETENT CERVIX   POST-OPERATIVE DIAGNOSIS:  history of INCOMPETENT CERVIX  PROCEDURE:  Procedure(s): CERCLAGE CERVICAL (N/A)  SURGEON:  Surgeon(s) and Role:    * Carrington Clamp, MD - Primary  ANESTHESIA:   spinal  EBL:  100 cc    LOCAL MEDICATIONS USED:  LIDOCAINE with epi <2 cc  SPECIMEN:  No Specimen  DISPOSITION OF SPECIMEN:  PATHOLOGY  COUNTS:  YES  TOURNIQUET:  * No tourniquets in log *  DICTATION: .Note written in EPIC  PLAN OF CARE: Admit for overnight observation  PATIENT DISPOSITION:  PACU - hemodynamically stable.   Delay start of Pharmacological VTE agent (>24hrs) due to surgical blood loss or risk of bleeding: not applicable

## 2018-09-21 NOTE — Anesthesia Procedure Notes (Signed)
Spinal  Patient location during procedure: OR Start time: 09/21/2018 10:45 AM End time: 09/21/2018 10:47 AM Reason for block: procedure for pain Staffing Anesthesiologist: Leilani Able, MD Performed: anesthesiologist and other anesthesia staff  Preanesthetic Checklist Completed: patient identified, site marked, surgical consent, pre-op evaluation, timeout performed, IV checked, risks and benefits discussed and monitors and equipment checked Spinal Block Patient position: sitting Prep: site prepped and draped and DuraPrep Patient monitoring: continuous pulse ox and blood pressure Approach: midline Location: L3-4 Injection technique: single-shot Needle Needle type: Pencan  Needle gauge: 24 G Needle length: 10 cm Needle insertion depth: 5 cm Assessment Sensory level: T10

## 2018-09-21 NOTE — Anesthesia Postprocedure Evaluation (Signed)
Anesthesia Post Note  Patient: JUMANA HARTWIG  Procedure(s) Performed: CERCLAGE CERVICAL (N/A )     Patient location during evaluation: OB High Risk Anesthesia Type: Spinal Level of consciousness: awake and alert Pain management: pain level controlled Vital Signs Assessment: post-procedure vital signs reviewed and stable Respiratory status: spontaneous breathing, nonlabored ventilation and respiratory function stable Cardiovascular status: stable Postop Assessment: no headache, no backache, no apparent nausea or vomiting, patient able to bend at knees, able to ambulate, spinal receding and adequate PO intake Anesthetic complications: no    Last Vitals:  Vitals:   09/21/18 1256 09/21/18 1400  BP: 102/60 (!) 112/58  Pulse: (!) 56 71  Resp: 18 16  Temp: 36.8 C 36.7 C  SpO2: 99% 100%    Last Pain:  Vitals:   09/21/18 1400  TempSrc: Oral  PainSc: 5    Pain Goal: Patients Stated Pain Goal: 3 (09/21/18 1400)                 Lirio Bach Hristova

## 2018-09-21 NOTE — Anesthesia Postprocedure Evaluation (Signed)
Anesthesia Post Note  Patient: NIRA NILAND  Procedure(s) Performed: CERCLAGE CERVICAL (N/A )     Patient location during evaluation: PACU Anesthesia Type: Spinal Level of consciousness: awake Pain management: pain level controlled Vital Signs Assessment: post-procedure vital signs reviewed and stable Respiratory status: spontaneous breathing Cardiovascular status: stable Postop Assessment: no headache, no backache, spinal receding, patient able to bend at knees and no apparent nausea or vomiting Anesthetic complications: no    Last Vitals:  Vitals:   09/21/18 1230 09/21/18 1256  BP: 98/61 102/60  Pulse: (!) 51 (!) 56  Resp: 15 18  Temp: 36.8 C 36.8 C  SpO2: 100% 99%    Last Pain:  Vitals:   09/21/18 1304  TempSrc:   PainSc: 4    Pain Goal: Patients Stated Pain Goal: 4 (09/21/18 1304)  LLE Motor Response: Purposeful movement (09/21/18 1230) LLE Sensation: No sensation (absent) (09/21/18 1230) RLE Motor Response: Purposeful movement (09/21/18 1230) RLE Sensation: No sensation (absent) (09/21/18 1230)     Epidural/Spinal Function Cutaneous sensation: No Sensation (09/21/18 1230), Patient able to flex knees: Yes (09/21/18 1230), Patient able to lift hips off bed: No (09/21/18 1230), Back pain beyond tenderness at insertion site: No (09/21/18 1230), Progressively worsening motor and/or sensory loss: No (09/21/18 1230), Bowel and/or bladder incontinence post epidural: No (09/21/18 1230)  Caren Macadam

## 2018-09-21 NOTE — Transfer of Care (Signed)
Immediate Anesthesia Transfer of Care Note  Patient: Carolyn Webb  Procedure(s) Performed: CERCLAGE CERVICAL (N/A )  Patient Location: PACU  Anesthesia Type:Spinal  Level of Consciousness: awake, alert  and oriented  Airway & Oxygen Therapy: Patient Spontanous Breathing  Post-op Assessment: Report given to RN and Post -op Vital signs reviewed and stable  Post vital signs: Reviewed and stable  Last Vitals:  Vitals Value Taken Time  BP    Temp    Pulse    Resp    SpO2      Last Pain:  Vitals:   09/21/18 0843  TempSrc:   PainSc: 0-No pain      Patients Stated Pain Goal: 4 (09/21/18 0843)  Complications: No apparent anesthesia complications

## 2018-09-21 NOTE — Op Note (Signed)
09/21/2018  11:26 AM  PATIENT:  Carolyn Webb  33 y.o. female  PRE-OPERATIVE DIAGNOSIS:  history of INCOMPETENT CERVIX   POST-OPERATIVE DIAGNOSIS:  history of INCOMPETENT CERVIX  PROCEDURE:  Procedure(s): CERCLAGE CERVICAL (N/A)  SURGEON:  Surgeon(s) and Role:    * Carrington Clamp, MD - Primary  ANESTHESIA:   spinal  EBL:  100 cc    LOCAL MEDICATIONS USED:  LIDOCAINE with epi <2 cc  SPECIMEN:  No Specimen  DISPOSITION OF SPECIMEN:  PATHOLOGY  COUNTS:  YES  TOURNIQUET:  * No tourniquets in log *  DICTATION: .Note written in EPIC  PLAN OF CARE: Admit for overnight observation  PATIENT DISPOSITION:  PACU - hemodynamically stable.   Delay start of Pharmacological VTE agent (>24hrs) due to surgical blood loss or risk of bleeding: not applicable Findings:  Technique:  After adequate spinal anesthesia was achieved the pt was examined. The pt was then prepped and draped in usual sterile fashion; a foley was placed and bladder emptied.  The cervix was grasped with a fenestrated rings and the bladder reflexion was injected with  2% lidocaine with epi (<2 cc).  The reflexion of the vagina onto the cervix was then tented up and carefully incised with the bovie up another centimeter, staying close to the cervix and away from the bladder.  A 0 double loaded Ticron was then used to go from 12:00 to 9:00 and 9:00 to 6:00 with one end and then 12:00 to 3:00 and 3:00 to 6:00 with the other end.  This stitch came close to the posterior os.  A second stitch was placed in the same way just inferior and  anteriorly to the previous stitch and then at least 2 cm superior on the posterior cervix.  Both stitches were tied down tight but not closing os and tied on the POSTERIOR CERVIX with long tags for identification.  The bladder flap was closed with a running stitch of 2-0 vicryl R.  The bladder was catherized with a foley for the entire procedure.  The pt tolerated the procedure well and  was returned to the recovery room in stable condition.

## 2018-09-22 ENCOUNTER — Encounter (HOSPITAL_COMMUNITY): Payer: Self-pay | Admitting: Obstetrics and Gynecology

## 2018-09-22 DIAGNOSIS — O3432 Maternal care for cervical incompetence, second trimester: Secondary | ICD-10-CM | POA: Diagnosis not present

## 2018-09-22 NOTE — Discharge Summary (Signed)
Physician Discharge Summary  Patient ID: Carolyn Webb MRN: [id number] DOB/AGE: 1986/04/25 33 y.o.  Admit date: 09/21/2018 Discharge date: 09/22/2018  Admission Diagnoses:incompetent cervix  Discharge Diagnoses:  Active Problems:   Postoperative state   Discharged Condition: good  Hospital Course: Uncomplicated modified shirodkar cerclage.  Consults: None  Significant Diagnostic Studies: none  Treatments: surgery: Uncomplicated modified shirodkar cerclage.   Discharge Exam: Blood pressure (!) 102/58, pulse (!) 57, temperature 98.2 F (36.8 C), temperature source Oral, resp. rate 18, height 5\' 2"  (1.575 m), weight 63 kg, SpO2 99 %, unknown if currently breastfeeding.   Disposition: Discharge disposition: 01-Home or Self Care       Discharge Instructions    Call MD for:  temperature >100.4   Complete by:  As directed    Diet - low sodium heart healthy   Complete by:  As directed    Discharge instructions   Complete by:  As directed    No driving on narcotics, no sexual activity for 2 weeks.   Increase activity slowly   Complete by:  As directed    May shower / Bathe   Complete by:  As directed    Shower, no bath for 2 weeks.   Remove dressing in 24 hours   Complete by:  As directed    Sexual Activity Restrictions   Complete by:  As directed    No sexual activity for 2 weeks.     Allergies as of 09/22/2018      Reactions   Sulfa Antibiotics Nausea Only      Medication List    TAKE these medications   PRENATAL PO Take 1 tablet by mouth daily.      Follow-up Information    Carrington Clamp, MD Follow up in 2 week(s).   Specialty:  Obstetrics and Gynecology Contact information: [address] [place] [address] [place] [address] [phone]           Signed: Loney Laurence 09/22/2018, 8:55 AM

## 2018-09-22 NOTE — Progress Notes (Signed)
Patient is eating, ambulating, and voiding.  Pain control is good.  Vitals:   09/21/18 1944 09/21/18 2308 09/22/18 0303 09/22/18 0830  BP: 109/62 109/60 106/63 (!) 102/58  Pulse: 66 (!) 57 (!) 53 (!) 57  Resp: 18 18 18 18   Temp: 98.1 F (36.7 C) 98 F (36.7 C) 97.7 F (36.5 C) 98.2 F (36.8 C)  TempSrc: Oral Oral Oral Oral  SpO2: 98% 99% 99% 99%  Weight:      Height:        lungs:   clear to auscultation cor:    RRR Abdomen:  soft, appropriate tenderness, incisions intact and without erythema or exudate. ex:    no cords   Lab Results  Component Value Date   WBC 5.5 09/21/2018   HGB 13.0 09/21/2018   HCT 39.8 09/21/2018   MCV 93.9 09/21/2018   PLT 312 09/21/2018    A/P  Routine care.  Expect d/c per plan.  Pt has had no bleeding or cramping. F/u 2 weeks in office for AFP and then will get cervical length at anatomy scan at 18 weeks.

## 2018-09-22 NOTE — Progress Notes (Signed)
Discharge instructions given to pt. Discussed signs and symptoms to report to the MD, upcoming appointments, and medications. Pt verbalizes understanding and has no questions or concerns at this time. Pt discharged from hospital in stable condition.

## 2019-01-23 ENCOUNTER — Encounter (HOSPITAL_COMMUNITY): Payer: Self-pay | Admitting: *Deleted

## 2019-01-23 ENCOUNTER — Other Ambulatory Visit: Payer: Self-pay

## 2019-01-23 ENCOUNTER — Inpatient Hospital Stay (HOSPITAL_COMMUNITY)
Admission: AD | Admit: 2019-01-23 | Discharge: 2019-02-16 | DRG: 788 | Disposition: A | Discharge status: Home / Self Care | Payer: 59 | Attending: Obstetrics and Gynecology | Admitting: Obstetrics and Gynecology

## 2019-01-23 DIAGNOSIS — Z3490 Encounter for supervision of normal pregnancy, unspecified, unspecified trimester: Secondary | ICD-10-CM | POA: Diagnosis present

## 2019-01-23 DIAGNOSIS — O368390 Maternal care for abnormalities of the fetal heart rate or rhythm, unspecified trimester, not applicable or unspecified: Secondary | ICD-10-CM

## 2019-01-23 DIAGNOSIS — Z3689 Encounter for other specified antenatal screening: Secondary | ICD-10-CM

## 2019-01-23 DIAGNOSIS — Z3A35 35 weeks gestation of pregnancy: Secondary | ICD-10-CM | POA: Diagnosis not present

## 2019-01-23 DIAGNOSIS — Z20828 Contact with and (suspected) exposure to other viral communicable diseases: Secondary | ICD-10-CM | POA: Diagnosis present

## 2019-01-23 DIAGNOSIS — O3433 Maternal care for cervical incompetence, third trimester: Secondary | ICD-10-CM | POA: Diagnosis present

## 2019-01-23 DIAGNOSIS — O26893 Other specified pregnancy related conditions, third trimester: Secondary | ICD-10-CM | POA: Diagnosis present

## 2019-01-23 DIAGNOSIS — Z3A32 32 weeks gestation of pregnancy: Secondary | ICD-10-CM

## 2019-01-23 DIAGNOSIS — Z3A34 34 weeks gestation of pregnancy: Secondary | ICD-10-CM | POA: Diagnosis not present

## 2019-01-23 DIAGNOSIS — Z9889 Other specified postprocedural states: Secondary | ICD-10-CM

## 2019-01-23 DIAGNOSIS — Z3A33 33 weeks gestation of pregnancy: Secondary | ICD-10-CM | POA: Diagnosis not present

## 2019-01-23 DIAGNOSIS — O368930 Maternal care for other specified fetal problems, third trimester, not applicable or unspecified: Secondary | ICD-10-CM | POA: Diagnosis not present

## 2019-01-23 DIAGNOSIS — O9902 Anemia complicating childbirth: Secondary | ICD-10-CM | POA: Diagnosis present

## 2019-01-23 DIAGNOSIS — D649 Anemia, unspecified: Secondary | ICD-10-CM | POA: Diagnosis present

## 2019-01-23 DIAGNOSIS — O09213 Supervision of pregnancy with history of pre-term labor, third trimester: Secondary | ICD-10-CM | POA: Diagnosis not present

## 2019-01-23 LAB — URINALYSIS, ROUTINE W REFLEX MICROSCOPIC
Bilirubin Urine: NEGATIVE
Glucose, UA: NEGATIVE mg/dL
Hgb urine dipstick: NEGATIVE
Ketones, ur: NEGATIVE mg/dL
Leukocytes,Ua: NEGATIVE
Nitrite: NEGATIVE
Protein, ur: NEGATIVE mg/dL
Specific Gravity, Urine: 1.025 (ref 1.005–1.030)
pH: 6 (ref 5.0–8.0)

## 2019-01-23 LAB — CBC
HCT: 37.2 % (ref 36.0–46.0)
Hemoglobin: 12.3 g/dL (ref 12.0–15.0)
MCH: 31.5 pg (ref 26.0–34.0)
MCHC: 33.1 g/dL (ref 30.0–36.0)
MCV: 95.1 fL (ref 80.0–100.0)
Platelets: 287 10*3/uL (ref 150–400)
RBC: 3.91 MIL/uL (ref 3.87–5.11)
RDW: 13.2 % (ref 11.5–15.5)
WBC: 8 10*3/uL (ref 4.0–10.5)
nRBC: 0 % (ref 0.0–0.2)

## 2019-01-23 LAB — SARS CORONAVIRUS 2 BY RT PCR (HOSPITAL ORDER, PERFORMED IN ~~LOC~~ HOSPITAL LAB): SARS Coronavirus 2: NEGATIVE

## 2019-01-23 LAB — TYPE AND SCREEN
ABO/RH(D): A POS
Antibody Screen: NEGATIVE

## 2019-01-23 LAB — OB RESULTS CONSOLE GBS: GBS: NEGATIVE

## 2019-01-23 MED ORDER — DOCUSATE SODIUM 100 MG PO CAPS
100.0000 mg | ORAL_CAPSULE | Freq: Every day | ORAL | Status: DC
  Administered 2019-01-24 – 2019-02-10 (×17): 100 mg via ORAL
  Filled 2019-01-23 (×18): qty 1

## 2019-01-23 MED ORDER — NIFEDIPINE 10 MG PO CAPS: 10.0000 mg | ORAL_CAPSULE | Freq: Four times a day (QID) | ORAL | Status: DC

## 2019-01-23 MED ORDER — NIFEDIPINE 10 MG PO CAPS
10.0000 mg | ORAL_CAPSULE | Freq: Once | ORAL | Status: AC
  Administered 2019-01-23: 10 mg via ORAL
  Filled 2019-01-23: qty 1

## 2019-01-23 MED ORDER — BETAMETHASONE SOD PHOS & ACET 6 (3-3) MG/ML IJ SUSP
12.0000 mg | INTRAMUSCULAR | Status: AC
  Administered 2019-01-23 – 2019-01-24 (×2): 12 mg via INTRAMUSCULAR
  Filled 2019-01-23 (×2): qty 2

## 2019-01-23 MED ORDER — TERBUTALINE SULFATE 1 MG/ML IJ SOLN
0.2500 mg | Freq: Once | INTRAMUSCULAR | Status: AC
  Administered 2019-01-23: 0.25 mg via SUBCUTANEOUS
  Filled 2019-01-23: qty 1

## 2019-01-23 MED ORDER — LACTATED RINGERS IV SOLN
INTRAVENOUS | Status: DC
  Administered 2019-01-23: 20:00:00 via INTRAVENOUS
  Administered 2019-01-23: 125 mL/h via INTRAVENOUS
  Administered 2019-01-24 – 2019-02-03 (×9): via INTRAVENOUS

## 2019-01-23 MED ORDER — ACETAMINOPHEN 325 MG PO TABS
650.0000 mg | ORAL_TABLET | ORAL | Status: DC | PRN
  Administered 2019-02-02: 650 mg via ORAL
  Filled 2019-01-23: qty 2

## 2019-01-23 MED ORDER — ZOLPIDEM TARTRATE 5 MG PO TABS: 5.0000 mg | ORAL_TABLET | Freq: Every evening | ORAL | Status: DC | PRN

## 2019-01-23 MED ORDER — CALCIUM CARBONATE ANTACID 500 MG PO CHEW: 2.0000 | CHEWABLE_TABLET | ORAL | Status: DC | PRN

## 2019-01-23 MED ORDER — NIFEDIPINE 10 MG PO CAPS: 20.0000 mg | ORAL_CAPSULE | Freq: Once | ORAL | Status: DC

## 2019-01-23 MED ORDER — NIFEDIPINE 10 MG PO CAPS
10.0000 mg | ORAL_CAPSULE | Freq: Four times a day (QID) | ORAL | Status: DC
  Administered 2019-01-23 – 2019-02-10 (×69): 10 mg via ORAL
  Filled 2019-01-23 (×71): qty 1

## 2019-01-23 MED ORDER — LACTATED RINGERS IV BOLUS
1000.0000 mL | Freq: Once | INTRAVENOUS | Status: AC
  Administered 2019-01-23: 1000 mL via INTRAVENOUS

## 2019-01-23 MED ORDER — PRENATAL MULTIVITAMIN CH
1.0000 | ORAL_TABLET | Freq: Every day | ORAL | Status: DC
  Administered 2019-01-24 – 2019-02-10 (×18): 1 via ORAL
  Filled 2019-01-23 (×18): qty 1

## 2019-01-23 NOTE — MAU Note (Signed)
Presents with c/o pelvic pressure that began 2 hours ago and a reddish/brown vaginal discharge.  Last intercourse 2 days ago.  Reports Hx of PTL & PTD.  Endorses +FM.

## 2019-01-23 NOTE — MAU Provider Note (Signed)
History     CSN: [id number]  Arrival date and time: 01/23/19 1616   First Provider Initiated Contact with Patient 01/23/19 1658      Chief Complaint  Patient presents with  . Vaginal Discharge  . Pelvic Pressure   [name] is a 33 y.o. G3P0110 at 2263w3d who presents to MAU for vaginal discharge and pelvic pressure. Pt reports last intercourse 2days ago. Pt reports she was informed that she should not have intercourse with a cerclage.  Onset: 2hrs ago Location: pelvis Duration: 2hrs Character: pt denies any pelvic pressure this pregnancy prior to today, pt denies feeling the need to push, pt reports this pressure "feels like when your period is about to start but the bleeding hasn't started yet," pt reports vaginal discharge was brownish-red that "looked like old blood mixed with a little bit of new blood" Aggravating/Associated: none/none Relieving: none Treatment: none  Pt reports she is feeling ctx on the monitor, and ctx is able to be palpated by the provider at the bedside.  Pt denies LOF, decreased FM, vaginal discharge/odor/itching. Pt denies N/V, abdominal pain, constipation, diarrhea, or urinary problems. Pt denies fever, chills, fatigue, sweating or changes in appetite. Pt denies SOB or chest pain. Pt denies dizziness, HA, light-headedness, weakness.  Problems this pregnancy include: cerclage (09/2018). Allergies? sulfa Current medications/supplements? PNVs Prenatal care provider? [facility], next appt 02/08/2019   OB History    Gravida  3   Para  1   Term  0   Preterm  1   AB  1   Living  0     SAB  1   TAB  0   Ectopic  0   Multiple  0   Live Births  1           Past Medical History:  Diagnosis Date  . Medical history non-contributory     Past Surgical History:  Procedure Laterality Date  . CERVICAL CERCLAGE N/A 09/21/2018   Procedure: CERCLAGE CERVICAL;  Surgeon: Carrington ClampHorvath, Michelle, MD;  Location: [facility] LD ORS;  Service:  Gynecology;  Laterality: N/A;  . NO PAST SURGERIES      Family History  Problem Relation Age of Onset  . Hypertension Maternal Grandmother   . High Cholesterol Maternal Grandmother   . Diabetes Maternal Grandfather   . High Cholesterol Other   . Muscular dystrophy Maternal Uncle   . Cleft lip Paternal Aunt   . Muscular dystrophy Maternal Uncle   . Muscular dystrophy Cousin     Social History   Tobacco Use  . Smoking status: Never Smoker  . Smokeless tobacco: Never Used  Substance Use Topics  . Alcohol use: Yes    Alcohol/week: 1.0 standard drinks    Types: 1 Cans of beer per week  . Drug use: No    Allergies:  Allergies  Allergen Reactions  . Sulfa Antibiotics Nausea Only    Medications Prior to Admission  Medication Sig Dispense Refill Last Dose  . Prenatal Vit-Fe Fumarate-FA (PRENATAL PO) Take 1 tablet by mouth daily.   01/23/2019 at 1000    Review of Systems  Constitutional: Negative for chills, diaphoresis, fatigue and fever.  Respiratory: Negative for shortness of breath.   Cardiovascular: Negative for chest pain.  Gastrointestinal: Negative for abdominal pain, constipation, diarrhea, nausea and vomiting.  Genitourinary: Positive for pelvic pain, vaginal bleeding and vaginal discharge. Negative for dysuria, flank pain, frequency and urgency.  Neurological: Negative for dizziness, weakness, light-headedness and headaches.  Physical Exam   Blood pressure 128/68, pulse 85, temperature 98 F (36.7 C), temperature source Oral, resp. rate 20, height 5\' 3"  (1.6 m), weight 78 kg, SpO2 99 %, unknown if currently breastfeeding.  Patient Vitals for the past 24 hrs:  BP Temp Temp src Pulse Resp SpO2 Height Weight  01/23/19 1931 128/68 - - - - - - -  01/23/19 1928 128/68 - - 85 - - - -  01/23/19 1835 - - - 69 - 99 % - -  01/23/19 1804 111/80 - - 75 - - - -  01/23/19 1733 121/77 - - 76 - - - -  01/23/19 1640 - - - - - 98 % - -  01/23/19 1632 115/75 98 F (36.7 C)  Oral 76 20 99 % - -  01/23/19 1625 - - - - - - 5\' 3"  (1.6 m) 78 kg   Physical Exam  Constitutional: She is oriented to person, place, and time. She appears well-developed and well-nourished. No distress.  HENT:  Head: Normocephalic and atraumatic.  Respiratory: Effort normal.  GI: Soft. She exhibits no distension and no mass. There is no abdominal tenderness. There is no rebound and no guarding.  Genitourinary: There is no rash, tenderness or lesion on the right labia. There is no rash, tenderness or lesion on the left labia. Uterus is enlarged. Uterus is not tender. Cervix exhibits discharge. Cervix exhibits no motion tenderness and no friability.    Vaginal discharge (scant white, mucus) present.     No vaginal tenderness or bleeding.  No tenderness or bleeding in the vagina.    Genitourinary Comments: Minimal red blood mixed with mucus coming from cervix. On CE, cervix closed, knot of cerclage felt, cerclage able to be easily palpated along 75% of circumference. No active bleeding noted. Brownish discharge present on gloves after cervical exam.   Neurological: She is alert and oriented to person, place, and time.  Skin: Skin is warm and dry. She is not diaphoretic.  Psychiatric: She has a normal mood and affect. Her behavior is normal. Judgment and thought content normal.   Results for orders placed or performed during the hospital encounter of 01/23/19 (from the past 24 hour(s))  Urinalysis, Routine w reflex microscopic     Status: None   Collection Time: 01/23/19  5:11 PM  Result Value Ref Range   Color, Urine YELLOW YELLOW   APPearance CLEAR CLEAR   Specific Gravity, Urine 1.025 1.005 - 1.030   pH 6.0 5.0 - 8.0   Glucose, UA NEGATIVE NEGATIVE mg/dL   Hgb urine dipstick NEGATIVE NEGATIVE   Bilirubin Urine NEGATIVE NEGATIVE   Ketones, ur NEGATIVE NEGATIVE mg/dL   Protein, ur NEGATIVE NEGATIVE mg/dL   Nitrite NEGATIVE NEGATIVE   Leukocytes,Ua NEGATIVE NEGATIVE   No results  found.  MAU Course  Procedures  MDM -PTL -Cervix closed on initial exam -EFM: reactive with irregular, but frequent, ctx that are felt by patient       -baseline: 140       -variability: moderate       -accels: present, 15x15       -decels: absent       -TOCO: irregular ctx (closest q601min, longest q598min), +irritability -2L LR + 3doses nifedipine q8120min + 0.25mg  terbutaline given -terbutaline given per discussion with Dr. Vicente SereneAyanwu @1830  -after medications and fluids, ctx less frequent, but still felt by patient, ctx now q468min on monitor and pt reporting she is feeling them less frequently, but they are  still present -called and spoke with Dr. Harrington Challenger @1940 , requests BMZ to be given and COVID swab order to be entered along with order for BMZ, Dr. Harrington Challenger to enter admission orders, will admit patient for observation -spoke with pt, pt aware of and agrees with plan -care transferred to Dr. Harrington Challenger -admit to [facility] for observation  Orders Placed This Encounter  Procedures  . SARS Coronavirus 2 (CEPHEID - Performed in [place] hospital lab), Hosp Order    Standing Status:   Standing    Number of Occurrences:   1    Order Specific Question:   Rule Out    Answer:   Yes  . Culture, beta strep (group b only)    Standing Status:   Standing    Number of Occurrences:   1  . Urinalysis, Routine w reflex microscopic    Standing Status:   Standing    Number of Occurrences:   1  . Notify physician (specify)    Standing Status:   Standing    Number of Occurrences:   20    Order Specific Question:   Notify Physician    Answer:   for pulse less than 60 or greater than 120    Order Specific Question:   Notify Physician    Answer:   for respiratory rate less than 12 or greater than 28    Order Specific Question:   Notify Physician    Answer:   for temperature greater than 100.4    Order Specific Question:   Notify Physician    Answer:   for urinary output less than 30 ml/hr    Order  Specific Question:   Notify Physician    Answer:   for systolic BP less than 80 or greater than 140    Order Specific Question:   Notify Physician    Answer:   for diastolic BP less than 40 or greater than 90  . Vital signs    While awake, respect sleep.    Standing Status:   Standing    Number of Occurrences:   1  . Defer vaginal exam for vaginal bleeding or PROM <37 weeks    Standing Status:   Standing    Number of Occurrences:   1  . Initiate Oral Care Protocol    Standing Status:   Standing    Number of Occurrences:   1  . Initiate Carrier Fluid Protocol    Standing Status:   Standing    Number of Occurrences:   1  . SCDs    Standing Status:   Standing    Number of Occurrences:   1    Order Specific Question:   Laterality    Answer:   Bilateral  . Fetal monitoring    Standing Status:   Standing    Number of Occurrences:   1  . Continuous tocometry    Standing Status:   Standing    Number of Occurrences:   1  . Bed rest with bathroom privileges    Standing Status:   Standing    Number of Occurrences:   1  . Full code    Standing Status:   Standing    Number of Occurrences:   1  . Type and screen [place]     Standing Status:   Standing    Number of Occurrences:   1  . Insert peripheral IV  Standing Status:   Standing    Number of Occurrences:   1  . Place in observation (patient's expected length of stay will be less than 2 midnights)    Standing Status:   Standing    Number of Occurrences:   1    Order Specific Question:   Hospital [facility]    Answer:   [facility] [[id number]]    Order Specific Question:   Level of Care    Answer:   Antepartum [20]    Order Specific Question:   Covid Evaluation    Answer:   Asymptomatic Screening Protocol (No Symptoms)    Order Specific Question:   Diagnosis    Answer:   Preterm labor in third trimester [[id number]]    Order Specific Question:   Admitting Physician     Answer:   Waynard ReedsOSS, KENDRA [[id number]]    Order Specific Question:   Attending Physician    Answer:   Waynard ReedsOSS, KENDRA [[id number]]    Order Specific Question:   PT Class (Do Not Modify)    Answer:   Observation [104]    Order Specific Question:   PT Acc Code (Do Not Modify)    Answer:   Observation [10022]   Meds ordered this encounter  Medications  . lactated ringers bolus 1,000 mL  . NIFEdipine (PROCARDIA) capsule 10 mg  . NIFEdipine (PROCARDIA) capsule 10 mg  . NIFEdipine (PROCARDIA) capsule 10 mg  . terbutaline (BRETHINE) injection 0.25 mg  . lactated ringers bolus 1,000 mL  . betamethasone acetate-betamethasone sodium phosphate (CELESTONE) injection 12 mg  . acetaminophen (TYLENOL) tablet 650 mg  . zolpidem (AMBIEN) tablet 5 mg  . docusate sodium (COLACE) capsule 100 mg  . calcium carbonate (TUMS - dosed in mg elemental calcium) chewable tablet 400 mg of elemental calcium  . prenatal multivitamin tablet 1 tablet  . lactated ringers infusion  . FOLLOWED BY Linked Order Group   . NIFEdipine (PROCARDIA) capsule 20 mg   . NIFEdipine (PROCARDIA) capsule 10 mg   Assessment and Plan   -care transferred to Dr. Tenny Crawoss -[facility]  Odie Seraicole E Brydon Spahr 01/23/2019, 7:52 PM

## 2019-01-23 NOTE — MAU Note (Addendum)
Consulted with Dr. Harrington Challenger rgdg for clarification of Procardia order in [facility]. Verified with Dr. Harrington Challenger that pt has already received Procardia 10mg  x3 and Terbutaline x1. A new order for Procardia 20mg  once and Procardia 10mg  every 6 hours placed by Dr. Harrington Challenger. Per Dr. Harrington Challenger states," I only want pt to have Procardia 10mg  now; then every 6hrs; not 20mg ". Spoke with Abigail Butts (pharm) and made aware. I will d/c order for Procardia 20mg  and admin Procardia 10mg  only.   Gilmer Mor RN

## 2019-01-24 LAB — ABO/RH: ABO/RH(D): A POS

## 2019-01-24 MED ORDER — MAGNESIUM SULFATE BOLUS VIA INFUSION
4.0000 g | Freq: Once | INTRAVENOUS | Status: AC
  Administered 2019-01-24: 4 g via INTRAVENOUS
  Filled 2019-01-24: qty 500

## 2019-01-24 MED ORDER — MAGNESIUM SULFATE 40 G IN LACTATED RINGERS - SIMPLE
2.0000 g/h | INTRAVENOUS | Status: AC
  Administered 2019-01-24: 2 g/h via INTRAVENOUS
  Filled 2019-01-24 (×2): qty 500

## 2019-01-24 NOTE — Progress Notes (Signed)
33 y.o. [id number] [redacted]w[redacted]d HD#1 admitted for Pressure Discharge.  Pt currently stable with no c/o now.  Good FM.  Vitals:   01/24/19 0501 01/24/19 0601 01/24/19 0654 01/24/19 0750  BP: 122/70 124/74    Pulse: 75 73    Resp:  16 16 16   Temp:    98 F (36.7 C)  TempSrc:    Oral  SpO2:    98%  Weight:      Height:        Lungs CTA Cor RRR Abd  Soft, gravid, nontender Ex SCDs FHTs  120s, good short term variability, NST R Toco  occ  Results for orders placed or performed during the hospital encounter of 01/23/19 (from the past 24 hour(s))  Urinalysis, Routine w reflex microscopic     Status: None   Collection Time: 01/23/19  5:11 PM  Result Value Ref Range   Color, Urine YELLOW YELLOW   APPearance CLEAR CLEAR   Specific Gravity, Urine 1.025 1.005 - 1.030   pH 6.0 5.0 - 8.0   Glucose, UA NEGATIVE NEGATIVE mg/dL   Hgb urine dipstick NEGATIVE NEGATIVE   Bilirubin Urine NEGATIVE NEGATIVE   Ketones, ur NEGATIVE NEGATIVE mg/dL   Protein, ur NEGATIVE NEGATIVE mg/dL   Nitrite NEGATIVE NEGATIVE   Leukocytes,Ua NEGATIVE NEGATIVE  Culture, beta strep (group b only)     Status: None (Preliminary result)   Collection Time: 01/23/19  7:51 PM   Specimen: Vaginal/Rectal; Genital  Result Value Ref Range   Specimen Description VAGINAL/RECTAL    Special Requests NONE    Culture      CULTURE REINCUBATED FOR BETTER GROWTH Performed at [facility], [address], [place], [place] [address]    Report Status PENDING   Type and screen [place]     Status: None   Collection Time: 01/23/19  8:09 PM  Result Value Ref Range   ABO/RH(D) A POS    Antibody Screen NEG    Sample Expiration      01/26/2019,2359 Performed at [facility], [place] [address], [place], [place] [address]   ABO/Rh     Status: None   Collection Time: 01/23/19  8:09 PM  Result Value Ref Range   ABO/RH(D)      A POS Performed at [place] [address], [place], [place]  [address]   SARS Coronavirus 2 (CEPHEID - Performed in [place] hospital lab), Hosp Order     Status: None   Collection Time: 01/23/19  8:37 PM   Specimen: Nasopharyngeal Swab  Result Value Ref Range   SARS Coronavirus 2 NEGATIVE NEGATIVE  CBC     Status: None   Collection Time: 01/23/19 10:24 PM  Result Value Ref Range   WBC 8.0 4.0 - 10.5 K/uL   RBC 3.91 3.87 - 5.11 MIL/uL   Hemoglobin 12.3 12.0 - 15.0 g/dL   HCT 37.2 36.0 - 46.0 %   MCV 95.1 80.0 - 100.0 fL   MCH 31.5 26.0 - 34.0 pg   MCHC 33.1 30.0 - 36.0 g/dL   RDW 13.2 11.5 - 15.5 %   Platelets 287 150 - 400 K/uL   nRBC 0.0 0.0 - 0.2 %    A:  HD#1  [redacted]w[redacted]d with preterm contractions with cerclage in place..  P: Continue magnesium sulfate for 24 hours.  Second BMZ dose due tomorrow am.  Cerclage in place.  Daria Pastures

## 2019-01-24 NOTE — Progress Notes (Signed)
Quarter-sized spot of reddish-brown discharge noted on bed pad. Pt denies pain and leaking of fluids. Encouraged pt to put on mesh panties and peri-pad to monitor discharge. Will continue to monitor.

## 2019-01-24 NOTE — H&P (Signed)
Carolyn Webb is a 33 y.o. female presenting for leaking fluid  33 yo G3P0110 @ 32+4 presents c/o leaking fluid. She was not found to be ruptured, however she was noted to be contracting. Despite nifedipine and terbutaline she continued to contract so the decision was made to admit her for observation and steroids. She has a cerclage in place due to a h/o prior PTD.  OB History    Gravida  3   Para  1   Term  0   Preterm  1   AB  1   Living  0     SAB  1   TAB  0   Ectopic  0   Multiple  0   Live Births  1          Past Medical History:  Diagnosis Date  . Medical history non-contributory    Past Surgical History:  Procedure Laterality Date  . CERVICAL CERCLAGE N/A 09/21/2018   Procedure: CERCLAGE CERVICAL;  Surgeon: Bobbye Charleston, MD;  Location: [facility];  Service: Gynecology;  Laterality: N/A;  . NO PAST SURGERIES     Family History: family history includes Cleft lip in her paternal aunt; Diabetes in her maternal grandfather; High Cholesterol in her maternal grandmother and another family member; Hypertension in her maternal grandmother; Muscular dystrophy in her cousin, maternal uncle, and maternal uncle. Social History:  reports that she has never smoked. She has never used smokeless tobacco. She reports previous alcohol use of about 1.0 standard drinks of alcohol per week. She reports that she does not use drugs.     Maternal Diabetes: No Genetic Screening: Normal Maternal Ultrasounds/Referrals: Normal Fetal Ultrasounds or other Referrals:  None Maternal Substance Abuse:  No Significant Maternal Medications:  None Significant Maternal Lab Results:  None Other Comments:  None  ROS History Dilation: Closed Exam by:: N. Nugent, NP Blood pressure 120/75, pulse 80, temperature 98 F (36.7 C), temperature source Oral, resp. rate 18, height 5\' 3"  (1.6 m), weight 78 kg, SpO2 97 %, unknown if currently breastfeeding. Exam Physical Exam  Prenatal  labs: ABO, Rh: --/--/A POS, A POS Performed at [facility], [place] [address] [place], [place] [address]  [id number]/08 2009) Antibody: NEG (07/08 2009) Rubella:  immune RPR:   NR HBsAg:   Neg HIV:   NR GBS:   pending  Assessment/Plan: 1) Admit 2) BMZ 3) SCDs 4) If contractions persist consider MgSO4 for tocolysisi   Vanessa Kick 01/24/2019, 2:25 AM

## 2019-01-25 LAB — CULTURE, BETA STREP (GROUP B ONLY)

## 2019-01-25 MED ORDER — NIFEDIPINE 10 MG PO CAPS
10.0000 mg | ORAL_CAPSULE | Freq: Once | ORAL | Status: AC
  Administered 2019-01-25: 10 mg via ORAL

## 2019-01-25 NOTE — Consult Note (Signed)
MFM Consult Telemedicine   Requesting provider: Dr. Malva LimesMark Anderson Date of Service: 01/25/2019 Reason for Request: Preterm labor with cerclage in place   Ms. Holstein is a G3P0 at 2168w5d dated by  I am seeing her today with regards to preterm labor at the request of Dr. Malva LimesMark Anderson  Ms. Homeyer notes that this pregnancy was initiated with an ultrasound indicated cerclage at 13 weeks. She had a prior 21 week delivery that presented and back pain and subsequently delivered. In this pregnancy,  she was came to [facility] due to brown discharge/ bleeding and noted that she was contracting but did not feel them.  She was noted to be closed and with thecerclage in place. While in admission she has received 2 doses of betamethasone, Magnesiums Sulfate, procardia 10 mg q 6 hrs and Terbutaline. She is no longer on magnesium and only on procardia. Her conctractions are 4-7 per hour, she feels no back pressure and the fetus has  Category I tracing.  Vitals:   01/25/19 0817 01/25/19 1146 01/25/19 1218 01/25/19 1219  BP: (!) 114/58 (!) 116/56 123/61 123/61  Pulse: 80 83 84   Resp: 18 18    Temp: 98.3 F (36.8 C) 98.8 F (37.1 C)    TempSrc: Oral Oral    SpO2: 99% 98%    Weight:      Height:         OB History  Gravida Para Term Preterm AB Living  3 1 0 1 1 0  SAB TAB Ectopic Multiple Live Births  1 0 0 0 1    # Outcome Date GA Lbr Len/2nd Weight Sex Delivery Anes PTL Lv  3 Current           2 Preterm 10/07/17 1791w5d / 00:06 411 g M Vag-Spont None  ND     Name: Henriquez,BOY Emylia     Apgar1: 1  Apgar5: 0  1 SAB            Past Medical History:  Diagnosis Date  . Medical history non-contributory    Past Surgical History:  Procedure Laterality Date  . CERVICAL CERCLAGE N/A 09/21/2018   Procedure: CERCLAGE CERVICAL;  Surgeon: Carrington ClampHorvath, Michelle, MD;  Location: [facility];  Service: Gynecology;  Laterality: N/A;  . NO PAST SURGERIES     No current facility-administered medications on file  prior to encounter.    Current Outpatient Medications on File Prior to Encounter  Medication Sig Dispense Refill  . Prenatal MV & Min w/FA-DHA (PRENATAL ADULT GUMMY/DHA/FA PO) Take 2 each by mouth daily.     Relationships  Social connections  . Talks on phone: Not on file  . Gets together: Not on file  . Attends religious service: Not on file  . Active member of club or organization: Not on file  . Attends meetings of clubs or organizations: Not on file  . Relationship status: Not on file   Family History  Problem Relation Age of Onset  . Hypertension Maternal Grandmother   . High Cholesterol Maternal Grandmother   . Diabetes Maternal Grandfather   . High Cholesterol Other   . Muscular dystrophy Maternal Uncle   . Cleft lip Paternal Aunt   . Muscular dystrophy Maternal Uncle   . Muscular dystrophy Cousin    Allergies  Allergen Reactions  . Sulfa Antibiotics Nausea Only   CBC Latest Ref Rng & Units 01/23/2019 09/21/2018 10/26/2017  WBC 4.0 - 10.5 K/uL 8.0 5.5 4.7  Hemoglobin 12.0 -  15.0 g/dL 12.3 13.0 14.9  Hematocrit 36.0 - 46.0 % 37.2 39.8 44.0  Platelets 150 - 400 K/uL 287 312 381   Impression/Counseling:  I discussed with Ms. Chambless the definition or preterm labor and the goals of management and treatment and outcomes.  She is 70 w 4 d has received appropriate tocolysis and steroids. She will be steroid completed tomorrow being 24 hours after has last dose.  I discussed that she would not require any further tocolysis given that she received bmz after 24 hours from the last dose.  She does need magnesium sulfate for neuorprotection given her gestational age.   Secondly, Ms. Cappiello has a cerclage in place with less contractions and not her typical symptoms of back pain. At this time I recommend the cerclage to remain unless her contraction become q5 min for over an hour, she experiences persistent back pain or there is visible tension on the cerclage stitch.  We  discussed neonatal outcomes at 32 weeks and explained that outcomes are favorable and we would recommend neonatalolgy consultation.  Lastly, if Ms. Haith remains stable for 12 to 24 hours it is reasonable to consider discharge with close follow up and preterm labor precautions, most notably persistent back pain.  Ms. Brose was able to have her additional questions answered and had no further questions.  I discussed this plan with Dr. Ouida Sills as well.  I spent 30 minutes with >50% in non-face to face consultation and coordination of care.  Vikki Ports, MD

## 2019-01-25 NOTE — Progress Notes (Signed)
HD#2 Pt states that she can still feel occ mild ctxs. She rates them as a 3-5/10 On NST she has irregular contractions. I checked her cx and it is long and closed. The cerclage is in place. She states that she has not had good results with Procardia in the past , Terb works better. She is still on IVFs. Steroids are completed. Plan/ Will discontinue IVFs tonight at 8pm           Will plan discharge to home with bedrest if she remains stable

## 2019-01-25 NOTE — Progress Notes (Signed)
Voicemail left for Dr Ouida Sills notifing of uterine activity, pt c/o slight pressure.

## 2019-01-26 NOTE — Progress Notes (Signed)
Provided nursing pads and makeshift support bra. Encouarged patient to have her own brought from home. Discussed lactation and uterine activity and need for breast support and not intentionally expressing milk or stimulating breasts.

## 2019-01-26 NOTE — Progress Notes (Signed)
Dr Ouida Sills  Was notified about the prolong decel. No further order given.

## 2019-01-26 NOTE — Progress Notes (Addendum)
Rn was in pt rm at 0526 to [place], RN heard fetal heart tone decrease from a baseline of about 130  Gradually to 70 in about 38mins. Pt's position was changed from supine to left lateral then to right lateral heart rate was still down the pt position was changed to knee chest position. Monitoring was lost for about 5 minutes when second RN came to help adjust [place]. Dr Elonda Husky house attending was notified and came to bedside. Pt position was changed to standing then heart rate was picked up again at this time.

## 2019-01-26 NOTE — Progress Notes (Signed)
HD#3 Pt recently had a prolonged decel which resolved with position changes. She continues to have 4-8 ctxs in an hour however she only feels 25% of them. No bleeding or discharge. IVFs were to be heplocked yesterday but this did not occur VSSAF IMP/ IUP at 32 wks with Premature contractions, cerclage in place        Given recent decel will not send pt home today.  Plan/ HL ivfs and follow

## 2019-01-26 NOTE — Progress Notes (Signed)
Discussed uterine activity with Dr. Ouida Sills. Per our conversation, I will contact Dr. Ouida Sills by phone if patient becomes uncomfortable with contractions.

## 2019-01-26 NOTE — Progress Notes (Signed)
Encouraged patient to stay hydrated and to keep bladder empty. Also asked that patient keep nurse aware of any feelings of baby balling up, back cramps, tightening in abdomen, etc.

## 2019-01-26 NOTE — Progress Notes (Signed)
Confirmed with lactation consultant regarding intervention inquiry for patient. Sherri with lactation agreed with plan previously told to patient, do not intervene. Wear breast pads and supportive bra. Advised patient.

## 2019-01-26 NOTE — Progress Notes (Signed)
When asking patient if she is feeling her contractions she says she is aware of them because of the monitor but it does not feel tightening and feels like, 'is that him moving?'

## 2019-01-27 NOTE — Progress Notes (Signed)
HD#4 Pt has noticed some edema in upper thighs. This is likely from the prolonged bedrest and IVFs. She currently does not have her SCDs on. Discussed the importance to this. No further decels. Only a rare contraction. She is still on procardia. She does not have abd pain or pelvic pressure IMP/ Stable Plan/ Will increase activity today and see if edema improves           Will monitor today and if still doing well will discharge to home tomorrow and recheck in office in 2-3 days

## 2019-01-27 NOTE — Plan of Care (Signed)
Pt. Doing well this AM. Reports CTXs as mild, sometimes stronger at night. In room with pt. During ctx pt. Breathing and carrying on normal conversation w/o stop during ctx.Will continue to monitor. P.t to call for new symptoms, bldg, or worsening of ctx.

## 2019-01-28 ENCOUNTER — Inpatient Hospital Stay (HOSPITAL_COMMUNITY): Payer: 59

## 2019-01-28 DIAGNOSIS — O3433 Maternal care for cervical incompetence, third trimester: Secondary | ICD-10-CM

## 2019-01-28 DIAGNOSIS — Z3A33 33 weeks gestation of pregnancy: Secondary | ICD-10-CM

## 2019-01-28 DIAGNOSIS — O09213 Supervision of pregnancy with history of pre-term labor, third trimester: Secondary | ICD-10-CM

## 2019-01-28 NOTE — Progress Notes (Signed)
Patient off monitor to go to Ultrasound.

## 2019-01-28 NOTE — Plan of Care (Signed)
  Problem: Safety: Goal: Ability to remain free from injury will improve Outcome: Progressing   Problem: Education: Goal: Knowledge of the prescribed therapeutic regimen will improve Outcome: Progressing   

## 2019-01-28 NOTE — Progress Notes (Signed)
HD#5 Pt doing well, no complaints.  Feels some crampiness at night, otherwise no contractions, LOF or VB. FHT: Currently Cat 1, approx 4am had 38min decel, per report had 9 min decel earlier in admission,  In between reassuring. TOCO: irritable, irregular ctx SVE: deferred A/P: HD# 5 admitted with bleeding and found to be contracting -pt has cerlage (two stitches), on prior exam closed, deferred today -spoke with MFM Dr. Donalee Citrin about Dr. Tonette Bihari idea of cerclage removal with delivery at 34 weeks given episodes of prolonged decels. Dr. Donalee Citrin not amenable to this plan currently.  Will obtain growth scan and BPP and we will comment on plan once that is complete. -pt otherwise doing well s/p beta x2, s/p MgSO4, currently on Procardia

## 2019-01-29 NOTE — Progress Notes (Signed)
33 y.o. [id number] [redacted]w[redacted]d HD#6 admitted for Pressure Discharge.  Preterm labor.  Pt currently stable with no c/o.  Good FM.  Vitals:   01/28/19 1626 01/28/19 1927 01/28/19 2311 01/29/19 0315  BP: 124/66 118/69 111/69 112/66  Pulse: 63 75 61 78  Resp: 18 18 18 18   Temp: 98.3 F (36.8 C) 98.6 F (37 C) 98.4 F (36.9 C) 98.3 F (36.8 C)  TempSrc: Oral  Oral Oral  SpO2: 99% 98% 99% 97%  Weight:      Height:        Lungs CTA Cor RRR Abd  Soft, gravid, nontender Ex SCDs FHTs  120s, good short term variability, NST R;  No decels seen in last 4 hours Toco  intermittant  No results found for this or any previous visit (from the past 24 hour(s)).  A:  HD#6  [redacted]w[redacted]d with cerclage in place and preterm labor. Previously: Pt doing well, no complaints.  Feels some crampiness at night, otherwise no contractions, LOF or VB. FHT: Currently Cat 1, approx 4am had 22min decel, per report had 9 min decel earlier in admission,  In between reassuring. TOCO: irritable, irregular ctx SVE: deferred A/P: HD# 6 admitted with bleeding and found to be contracting -pt has cerlage (two stitches), on prior exam closed, deferred today -spoke with MFM Dr. Donalee Citrin about Dr. Tonette Bihari idea of cerclage removal with delivery at 34 weeks given episodes of prolonged decels. Dr. Donalee Citrin not amenable to this plan currently.  Will obtain growth scan and BPP and we will comment on plan once that is complete. -pt otherwise doing well s/p beta x2, s/p MgSO4, currently on Procardia         [place] for growth yesterday showed EFW 4#2, 13%ile, normal dopplers, AFI 14.68.  Plan is to monitor for another 48-72 hours and see if decels recur, etiology presents itself or all resolves.  Continue obs today and will stay on continous monitoring as well.  Daria Pastures

## 2019-01-29 NOTE — Progress Notes (Signed)
Pt had 1-6 mild hourly contractions during the night. Pt did not report any significant pain or discomfort during contractions. Pt had late, variable and prolonged decels throughout the night, but with quick recovery. No other issues noted.

## 2019-01-30 NOTE — Progress Notes (Signed)
Patient off the monitor for a shower.

## 2019-01-31 NOTE — Progress Notes (Signed)
Initial Nutrition Assessment  DOCUMENTATION CODES:   Not applicable  INTERVENTION:  Regular Diet Snacks TID  per pt request  NUTRITION DIAGNOSIS:  Increased nutrient needs related to (pregnancy and fetal growth requirements) as evidenced by (33 weeks IUP).  GOAL:  Patient will meet greater than or equal to 90% of their needs  MONITOR:  Weight trends  REASON FOR ASSESSMENT:  Antenatal   ASSESSMENT:   33 4/7 weeks, PTL. pre-preg wt 140 lbs, wt gain 31 lbs.  Diet Order:   Diet Order            Diet regular Room service appropriate? Yes; Fluid consistency: Thin  Diet effective now             EDUCATION NEEDS:   No education needs have been identified at this time  Skin:  Skin Assessment: Reviewed RN Assessment  Height:   Ht Readings from Last 1 Encounters:  01/23/19 5\' 3"  (1.6 m)    Weight:   Wt Readings from Last 1 Encounters:  01/23/19 78 kg    Ideal Body Weight:    115 lbs BMI:  Body mass index is 30.47 kg/m.  Estimated Nutritional Needs:   Kcal:  1800-2000  Protein:  80-90 g  Fluid:  2.1L    Weyman Rodney M.Fredderick Severance LDN Neonatal Nutrition Support Specialist/RD III Pager [phone]      Phone [phone]

## 2019-01-31 NOTE — Progress Notes (Signed)
33 y.o. [id number] [redacted]w[redacted]d HD#8 admitted for Pressure Discharge/preterm labor  Pt currently stable with no c/o today.  Good FM.  Vitals:   01/30/19 1652 01/30/19 1932 01/30/19 2326 01/31/19 0551  BP: 114/70 124/72 (!) 111/58 109/66  Pulse: 80 72 63 71  Resp: 18 18 18 16   Temp: 97.9 F (36.6 C) 98.8 F (37.1 C) 98.5 F (36.9 C) 98.4 F (36.9 C)  TempSrc: Oral Oral Oral Oral  SpO2: 100% 100% 100% 100%  Weight:      Height:        Lungs CTA Cor RRR Abd  Soft, gravid, nontender Ex SCDs FHTs  130s, good short term variability, NST R, cat one tracing. Toco  occ now  No results found for this or any previous visit (from the past 24 hour(s)).  A:  HD#8  [redacted]w[redacted]d with preterm labor with cerclage in place.  Pt has been stable without but had possible decel at about 15:50 yesterday for about 3-4 minutes.  I was with a contraction.  P: I am still reluctant to let pt go with another decel.  Will continue obs for now.    Daria Pastures

## 2019-02-01 MED ORDER — LACTATED RINGERS IV BOLUS
1000.0000 mL | Freq: Once | INTRAVENOUS | Status: AC
  Administered 2019-02-01: 1000 mL via INTRAVENOUS

## 2019-02-01 NOTE — Progress Notes (Addendum)
Contact with Dr. Rogue Bussing in regards to patient reporting increase in contractions and increased pain. Pt contractions 5-8 minutes apart with uterine irritability. Contractions palpating strong with adequate relaxation between. Pt denies any vaginal bleeding or leaking fluid. LR bolus infusing per Dr. Elvis Coil order. Pt rate pain at 7/10 and Doctor aware. Will continue to monitor. Toya Smothers, RN

## 2019-02-01 NOTE — Progress Notes (Signed)
Pt contracting irregularly. Pt states she still is having contractions but less frequent. Pt given scheduled dose of Procardia. Still remains with no vaginal bleeding or leaking fluid. Positive fetal movement. FHR stable and reassuring. Toya Smothers, RN.

## 2019-02-01 NOTE — Progress Notes (Signed)
HD#9 admitted for pressure/discharge/PTL, remained inhouse d/t occ. FHR decelerations.   Pt denies any complaints today. A/P: 33w5 with cerclage in place, stable from PTL standpoint Reviewed tracing for last 24 hours, Cat 1, last decel 7/15 approx 15:50 Cont procardia and observation, other routine care.

## 2019-02-01 NOTE — Consult Note (Signed)
Consult previously completed. I discussed with Dr. Ouida Sills and Dr. Rogue Bussing.  Samul Dada, MD.

## 2019-02-02 MED ORDER — CYCLOBENZAPRINE HCL 10 MG PO TABS
10.0000 mg | ORAL_TABLET | Freq: Three times a day (TID) | ORAL | Status: DC | PRN
  Administered 2019-02-02: 10 mg via ORAL
  Filled 2019-02-02 (×2): qty 1

## 2019-02-02 NOTE — Progress Notes (Signed)
Pt off monitor to shower. Toya Smothers, RN

## 2019-02-02 NOTE — Progress Notes (Signed)
Pt having regular contractions 7-8 min apart. Pt given Procardia before scheduled time. Pt states her pain level as 3/10 with she is having contractions. Pt denies vaginal bleeding or leaking of fluid. Toya Smothers, RN

## 2019-02-02 NOTE — Progress Notes (Signed)
Patient denies feeling contractions, VB or LOF.  She reports good FM.  We discussed the episode of more painful contractions yesterday, she states they resolved and she has felt fine since.  She denies any other complaints at this time.  Vitals:   02/01/19 2319 02/01/19 2320 02/02/19 0437 02/02/19 0808  BP: 112/64  108/74 117/66  Pulse: 77  70 83  Resp: 18  18 16   Temp: 98.3 F (36.8 C)  98.6 F (37 C) 98.5 F (36.9 C)  TempSrc: Oral  Oral Oral  SpO2: 100% 99% 100% 98%  Weight:      Height:       FHR: 150 mod var +accels, no recent decels TOCO: currently quite, this morning had 4-8 qhr SVE: deferred  Lab Results  Component Value Date   WBC 8.0 01/23/2019   HGB 12.3 01/23/2019   HCT 37.2 01/23/2019   MCV 95.1 01/23/2019   PLT 287 01/23/2019    --/--/A POS, A POS (07/08 2009)  A/P HD#10 33.6 with cerclage in place initially admitted for contractions, now remaining d/t intermittent prolonged fetal decels. Given continued intermittent fetal decels this warrants in-hospital care.  Plan to discuss with MFM Monday.  Routine care.      Carolyn Webb

## 2019-02-03 NOTE — Progress Notes (Signed)
HD# 11 admitted with cerclage and PTL, remained for intermittent prolonged fetal decelerations with mainly  Cat 1 tracing in between.  Pt reports periodic episodes where rhythmic right sided back pain worsens and coincides with traced contractions.  She reports intermittetn abd tightening that is not painful.  Mainly problem is in upper right back and heating pad helps.  She denies LOF/VB. She reports good FM. She states she had an episode of worsening pain around 6am, now that has improved. Pt denies urinary symptoms.   Vitals:   02/02/19 1640 02/02/19 1958 02/03/19 0529 02/03/19 0801  BP:  97/66 114/67 113/66  Pulse:  76 74 76  Resp:  18 17 17   Temp:  98.5 F (36.9 C) 98.3 F (36.8 C) 98.5 F (36.9 C)  TempSrc:  Oral Oral Oral  SpO2: 100% 100% 97% 99%  Weight:      Height:       Gen: comfortably resting, not displaying discomfort. Abd: gravid, nontender Back right sided lower thoracic perispinal discomfort, no flank pain, CVA tenderness Ext: no calf tenderness Observed patient curing traced contraction and she denied feeling any increased pain. FHT: 135 mod var, + accels, no current decel.  Had prolonged deceleration of 8-9 mins early this morning, resolved with position changes and has remained essentially Cat 1 since. TOCO: 6 in last hour, currently pt not feeling increased pain Lab Results  Component Value Date   WBC 8.0 01/23/2019   HGB 12.3 01/23/2019   HCT 37.2 01/23/2019   MCV 95.1 01/23/2019   PLT 287 01/23/2019    --/--/A POS, A POS (07/08 2009)  A/P HD #11 34.0 with cerclage being monitored for PTL/fetal deceleration. Will continue Procardia for symptomatic contraction pain. Cervix checked yesterday with worsening episode of pain approx 18:45, cervix found to be long, thick, closed with cerclage intact, no strain of cervix and head very high.  Will continue to monitor.  Cerclage removal not indicated at this time. MFM has been consulted about plan for patient.  Can  readdress recommended plan with them on Monday given persistent episodes of prolonged fetal decelerations. Overall pt currently stable without complaints aside from lower right sided thoracic pain improved with heating pad.  Routine care.   Allyn Kenner

## 2019-02-03 NOTE — Plan of Care (Signed)
Pt is doing well. She states she had an uneventful day. Denies bleeding, vaginal fluid or d/c, pressure, or pain at this time. She is eating with her husband currently. Will continue to assess EFM throughout night. Pt to see MFM tomorrow per provider notes.

## 2019-02-03 NOTE — Plan of Care (Signed)
  Problem: Clinical Measurements: Goal: Cardiovascular complication will be avoided Outcome: Progressing   Pt. Encouraged to walk in room and in hallway on FHR. IVF d/c this AM as pt. Is eating/drinking, will evaluate for further need throughout the day.

## 2019-02-04 ENCOUNTER — Inpatient Hospital Stay (HOSPITAL_COMMUNITY): Payer: 59

## 2019-02-04 DIAGNOSIS — O3433 Maternal care for cervical incompetence, third trimester: Secondary | ICD-10-CM

## 2019-02-04 DIAGNOSIS — O368930 Maternal care for other specified fetal problems, third trimester, not applicable or unspecified: Secondary | ICD-10-CM

## 2019-02-04 DIAGNOSIS — O09213 Supervision of pregnancy with history of pre-term labor, third trimester: Secondary | ICD-10-CM

## 2019-02-04 DIAGNOSIS — Z3A34 34 weeks gestation of pregnancy: Secondary | ICD-10-CM

## 2019-02-04 NOTE — Plan of Care (Signed)
  Problem: Activity: Goal: Risk for activity intolerance will decrease Outcome: Progressing  Pt. Plans to walk today. Will notify RN when ready. MFM consult today.

## 2019-02-04 NOTE — Progress Notes (Signed)
[id number] at [redacted]w[redacted]d HD# 11 admitted with cerclage and preterm contractions.  Contractions increased on Saturday, but cervix remained closed / long with no pull on still or presenting part deep in pelvis per Dr. Rogue Bussing.  Since that time, patient is symptomatically improved--denies vb or lof.    Vitals:   02/03/19 1743 02/03/19 1958 02/03/19 2231 02/04/19 0750  BP: 121/72 116/67 112/71 112/73  Pulse: 76 79 70 80  Resp: 16 17 17 17   Temp: 97.8 F (36.6 C) 98.5 F (36.9 C) 98.3 F (36.8 C) 98.2 F (36.8 C)  TempSrc: Oral Oral Oral Oral  SpO2: 98% 100% 100% 98%  Weight:      Height:       Gen: comfortably resting, not displaying discomfort. Abd: gravid, nontender Ext: no calf tenderness, trace edema FHT: 130s, moderate variability, + accelerations.  No decelerations overnight TOCO: Rare contraction overnight Lab Results  Component Value Date   WBC 8.0 01/23/2019   HGB 12.3 01/23/2019   HCT 37.2 01/23/2019   MCV 95.1 01/23/2019   PLT 287 01/23/2019    7/13--EFW 1876 gm (13%, AC 48%), DVP 7cm  --/--/A POS, A POS (07/08 2009)  A/P:  G3P0110 at [redacted]w[redacted]d HD #12 with cerclage being monitored for preterm contractions and recurrent fetal decelerations Has completed a course of BMZ.  Contractions are sporadic and mild over last 36 hours.  Cervix checked on 7/18 andlong, thick, closed with cerclage intact, no strain of cervix and head very high.   She is not in labor and could be discharged with the exception of recurrent decelerations.  Last deceleration was 7/19 at 0400--9 minute deceleration.  Fetal monitoring has otherwise remained category 1 with accelerations.  Fetal monitoring reviewed and shows a prolonged decelerations approximately once daily since admission.  Growth was 13%.  She has no underlying medical co-morbidities that would put her at increased risk of placental insufficiency.  The patient has a prior fetal loss at 20 weeks.  She is very nervous about discharge at this time with  continued prolonged decelerations on fetal monitoring.  Will consult MFM again today.    [place]

## 2019-02-04 NOTE — Consult Note (Addendum)
MFM Consult Follow up Telemedicine  Requesting provider: Jerelyn Charles, MD Date of service: 02/04/19 Reason for request: Preterm labor with occasional prolonged fetal heart rate decelerations  Ms. Angell is a G3P0 who is at [place] 1d who has been monitored for preterm labor. She is doing well overall and her contractions have resolved. She denies feeling pelvic pressure. She is s/p BMZ. FHR Cat 1 however, every 24-36 hours a prolonged fetal deceleration occurs. This findings is concerning for discharge and Ms. Tejada feels concerned about going home with the frequency of this finding.  Per records cervix remains long and closed.  BPP today was 8/8  Vitals:   02/04/19 0750 02/04/19 1319  BP: 112/73 113/68  Pulse: 80 70  Resp: 17 18  Temp: 98.2 F (36.8 C) 98.6 F (37 C)  SpO2: 98% 99%   Impression/Plan  Regular prolonged fetal heart rate decelerations.   I discussed Ms. Sturges and Dr. Carlis Abbott at this time we recommend continued inpatient monitoring. May consider discharge if the fetus remains Category 1 without evidence of prolonged deceleration for 48-72 hours.  Remove cerclage at 36 weeks  I spent 15  minutes with >50% in non-face to face consultation and care coordination with Ms. Boda.  Vikki Ports, MD

## 2019-02-04 NOTE — Progress Notes (Signed)
11:20, FHR removed for pt. To walk halls and shower 11:45, pt. Transported to Ultrasound for BPP.

## 2019-02-05 NOTE — Progress Notes (Signed)
Pt had a prolonged deceleration around 0201 lasting approx. 5 min. Called provider. Pt denies feeling ctx, cramping, pressure, bleeding, or vaginal d/c. Dr. Carlis Abbott reviewed EFM strip and stated continue to monitor and call if another prolonged occurs or other significant changes occur. Will continue to monitor.

## 2019-02-05 NOTE — Progress Notes (Signed)
Pt was in bed when I visited.  She shared that she is doing okay and just trying to rest while baby is monitored. We talked a bit about the loss of her son, Carolyn Webb, in March of last year at 21 weeks.  She views this pregnancy as a blessing in the midst of her grief and feels less worried now that she has reached a point where this baby, Carolyn Webb, will be okay if he is born.  She is trying to be patient as she knows that's best for him, but is eager to meet her son.  Her husband comes to visit but she feels a bit isolated.  I reminded her that we are available for companionship if she would like, which she expressed gratitude for.  Please page as further needs arise.  Donald Prose. Elyn Peers, M.Div. [facility] Chaplain Pager [phone] Office [phone]   02/05/19 1500  Clinical Encounter Type  Visited With Patient  Visit Type Initial;Psychological support;Spiritual support  Spiritual Encounters  Spiritual Needs Emotional

## 2019-02-05 NOTE — Progress Notes (Signed)
Patient ID: Carolyn Webb, female   DOB: [birth year], 33 y.o.   MRN: [id number]   HD#12  33 yo G3P0110 @ 34+2 with PTL, cervical cerclage, and Q 24 hr decelerations  S: Pt comfortable, has been having contractions on and off. She just feels tightening in her abdomen when they occur, nothing painful. Active fetal movement O: AFVSS FHR 140, reactive, cat 1 tracing. DId have a 5 minute deceleration overnight. Tracing was reactive before and after Abd soft  A/P 1) Continue hospital observation due to Q 24 hr decelerations. If can go Q48-72 hours without decel can consider d/c home 2) S/P Magnesium for neuroprophylaxis 3) S/P BMZ

## 2019-02-06 LAB — TYPE AND SCREEN
ABO/RH(D): A POS
Antibody Screen: NEGATIVE

## 2019-02-06 NOTE — Progress Notes (Signed)
HD#13  33 yo G3P0110 @ 34+3 with PTL, cervical cerclage, and Q 24 hr decelerations  S: Pt comfortable, has been having contractions on and off. She just feels tightening in her abdomen when they occur, nothing painful. Active fetal movement.  O: AFVSS FHR 135, reactive, cat 1 tracing. Had 52min decel at 05:48 this morning with return to Cat 1 TOCO: 5-6 ctx qhr, observed pt during ctx and she was comfortable without complaint.   Vitals:   02/05/19 1651 02/05/19 2355 02/06/19 0528 02/06/19 0820  BP: 122/66 114/65 112/70 (!) 112/57  Pulse: 74 70 67 71  Resp: 18 18 18 18   Temp: 98.5 F (36.9 C) 98 F (36.7 C) 98 F (36.7 C) 97.7 F (36.5 C)  TempSrc: Oral Oral Oral Oral  SpO2: 99%  99% 100%  Weight:      Height:        Lab Results  Component Value Date   WBC 8.0 01/23/2019   HGB 12.3 01/23/2019   HCT 37.2 01/23/2019   MCV 95.1 01/23/2019   PLT 287 01/23/2019    A/P HD#13 1) Continue hospital observation due to Q 24 hr decelerations. Per MFM can go Q48-72 hours without decel can consider d/c home. They recommend removal of cerclage at 36weeks. 2) S/P Magnesium for neuroprophylaxis 3) S/P BMZ  Allyn Kenner

## 2019-02-06 NOTE — Progress Notes (Addendum)
Pt had a prolong decel around 0548 that lasted about 6 mins. Dr Harrington Challenger was notified no further orders. Will conitinue to monitor closely.

## 2019-02-06 NOTE — Progress Notes (Signed)
Irregular contractions during this shift, but pt states she does not feel them just occassional tightness and had a couple of variables. Will continue to monitor closely.

## 2019-02-07 MED ORDER — LACTATED RINGERS IV BOLUS
500.0000 mL | Freq: Once | INTRAVENOUS | Status: AC
  Administered 2019-02-07: 500 mL via INTRAVENOUS

## 2019-02-07 NOTE — Progress Notes (Signed)
HD#14  33 yo G3P0110 @ 34+3 with PTL, cervical cerclage, and daily fetal heart rate decelerations  S: Pt comfortable.  Contracting on monitor--has not felt any in last day.  Active fetal movement. No VB, no LOF.     EFM:  140s, moderate variability, + accels, category 1, with the exception of a 9 minute deceleration at 0608 with return to Cat 1 TOCO: q6-10 minutes, observed patient during 3 contractions and she was comfortable and did not feel contraction   Vitals:   02/06/19 2330 02/07/19 0552 02/07/19 0745 02/07/19 1225  BP: 126/66 (!) 112/55 124/67 123/76  Pulse: 74  79 72  Resp: 18 18 18 18   Temp: 98.4 F (36.9 C) 98.4 F (36.9 C) 97.8 F (36.6 C) 98 F (36.7 C)  TempSrc: Oral Oral Oral Oral  SpO2: 97% 99% 97% 99%  Weight:      Height:        Lab Results  Component Value Date   WBC 8.0 01/23/2019   HGB 12.3 01/23/2019   HCT 37.2 01/23/2019   MCV 95.1 01/23/2019   PLT 287 01/23/2019    A/P HD#14 1) Continue hospital observation due to Q 24 hr decelerations. Per MFM can go Q48-72 hours without decel can consider d/c home. They recommend removal of cerclage at 36weeks. 2) S/P Magnesium for neuroprophylaxis 3) S/P [place]

## 2019-02-08 ENCOUNTER — Encounter (HOSPITAL_COMMUNITY): Payer: Self-pay | Admitting: *Deleted

## 2019-02-08 NOTE — Plan of Care (Signed)
  Problem: Clinical Measurements: Goal: Complications related to the disease process, condition or treatment will be avoided or minimized Outcome: Progressing  Discussed with patient stress management techniques (breathing specifically) discussed monitoring of baby, plan for evening. No complaints of pain, no new s/s such as leakage or bleeding. Pt. Will report changes immediately.

## 2019-02-08 NOTE — Progress Notes (Signed)
I offered support to Carolyn Webb who is doing well as she awaits baby Carolyn Webb.  She feels well supported and grateful that her husband is able to be here with her every night.  She has been grateful for chaplain support during this hospitalization and when her son, Carolyn Webb, was born at 78 weeks.    [place], Bcc Pager, [phone] 1:29 PM

## 2019-02-08 NOTE — Progress Notes (Signed)
33 y.o. [id number] [redacted]w[redacted]d HD#16 admitted for Pressure Discharge.  Pt currently stable with no c/o today.  Good FM.  Vitals:   02/07/19 1535 02/07/19 1944 02/07/19 2322 02/08/19 0653  BP: 122/68 123/69 129/70 116/62  Pulse: 80 82 76 70  Resp: 17 18 18 18   Temp: 97.6 F (36.4 C) 98.6 F (37 C) 98.6 F (37 C) 98.4 F (36.9 C)  TempSrc: Oral Oral Oral Oral  SpO2: 100% 100% 100% 100%  Weight:      Height:        Lungs CTA Cor RRR Abd  Soft, gravid, nontender Ex SCDs FHTs  130s, good short term variability, NST R.  At 2300 had a 2 minute decel to 80s.  Toco  Now q 5-10  No results found for this or any previous visit (from the past 24 hour(s)).  A:  HD#16  [redacted]w[redacted]d with preterm labor and continued intermittent prolonged decels.  P: 1) Continue hospital observation due to Q 24 hr decelerations. Per MFM can go Q48-72 hours without decel can consider d/c home. They recommend removal of cerclage at 36weeks. Pt is however having more regular and frequent contractions.  If bleeding or pain with contractions, will check pt and remove cerclage if dilation present and allow for spontaneous vaginal delivery. 2) S/P Magnesium for neuroprophylaxis 3) S/P BMZ   Daria Pastures

## 2019-02-08 NOTE — Progress Notes (Signed)
DR [facility] CALLED PAST decelaration to see pt   And evaluate

## 2019-02-09 LAB — TYPE AND SCREEN
ABO/RH(D): A POS
Antibody Screen: NEGATIVE

## 2019-02-09 NOTE — Progress Notes (Signed)
33 y.o. [id number] [redacted]w[redacted]d HD#17 admitted for PTL and continued decels.  Pt currently stable with no c/o this am but is still anxious about the decels.  Good FM.  No LOF or bleeding.  Pt does not feel contractions.   Vitals:   02/08/19 2010 02/08/19 2334 02/08/19 2335 02/09/19 0414  BP: (!) 112/56 115/68  110/66  Pulse: 82 70  85  Resp: 18 16  18   Temp: 98.4 F (36.9 C) 98.4 F (36.9 C)  98.4 F (36.9 C)  TempSrc: Oral Oral  Oral  SpO2: 99% 96% 96% 99%  Weight:      Height:        Lungs CTA Cor RRR Abd  Soft, gravid, nontender Ex SCDs FHTs  130s, good short term variability, NST R.  Yesterday there were 4 episodes of decels lasting in 90s for about 3-4 minutes. Toco  Continues q 5-10  Results for orders placed or performed during the hospital encounter of 01/23/19 (from the past 24 hour(s))  Type and screen [facility]     Status: None (Preliminary result)   Collection Time: 02/09/19  6:28 AM  Result Value Ref Range   ABO/RH(D) PENDING    Antibody Screen PENDING    Sample Expiration      02/12/2019,2359 Performed at [facility], [place] [address] [place], [place] [address]     A:  HD#17  [redacted]w[redacted]d with PTL, cerclage in place and fetal heart rated decels occurring randomly but now more frequently.  P: If LOF, vaginal bleeding, prolonged decels or labor, will deliver.  At this point, if decels are happening more frequently, will consider the risks of a 35 week delivery versus the risks of an emergent c/s for fetal distress.    Carolyn Webb

## 2019-02-09 NOTE — Progress Notes (Signed)
Dr Philis Pique called and she would like to be contacted when patient has decels longer than one minute.

## 2019-02-10 ENCOUNTER — Inpatient Hospital Stay (HOSPITAL_COMMUNITY): Payer: 59

## 2019-02-10 DIAGNOSIS — O3433 Maternal care for cervical incompetence, third trimester: Secondary | ICD-10-CM

## 2019-02-10 DIAGNOSIS — O09213 Supervision of pregnancy with history of pre-term labor, third trimester: Secondary | ICD-10-CM

## 2019-02-10 DIAGNOSIS — O368930 Maternal care for other specified fetal problems, third trimester, not applicable or unspecified: Secondary | ICD-10-CM

## 2019-02-10 DIAGNOSIS — Z3490 Encounter for supervision of normal pregnancy, unspecified, unspecified trimester: Secondary | ICD-10-CM | POA: Diagnosis present

## 2019-02-10 DIAGNOSIS — Z3A35 35 weeks gestation of pregnancy: Secondary | ICD-10-CM

## 2019-02-10 MED ORDER — SOD CITRATE-CITRIC ACID 500-334 MG/5ML PO SOLN
30.0000 mL | ORAL | Status: DC | PRN
  Administered 2019-02-12: 30 mL via ORAL
  Filled 2019-02-10: qty 30

## 2019-02-10 MED ORDER — TERBUTALINE SULFATE 1 MG/ML IJ SOLN
0.2500 mg | Freq: Once | INTRAMUSCULAR | Status: DC | PRN
  Administered 2019-02-12: 0.25 mg via SUBCUTANEOUS

## 2019-02-10 MED ORDER — OXYCODONE-ACETAMINOPHEN 5-325 MG PO TABS: 1.0000 | ORAL_TABLET | ORAL | Status: DC | PRN

## 2019-02-10 MED ORDER — LACTATED RINGERS IV SOLN
500.0000 mL | INTRAVENOUS | Status: DC | PRN
  Administered 2019-02-11: 500 mL via INTRAVENOUS

## 2019-02-10 MED ORDER — BUTORPHANOL TARTRATE 1 MG/ML IJ SOLN
1.0000 mg | INTRAMUSCULAR | Status: DC | PRN
  Administered 2019-02-12: 1 mg via INTRAVENOUS
  Filled 2019-02-10: qty 1

## 2019-02-10 MED ORDER — ACETAMINOPHEN 325 MG PO TABS: 650.0000 mg | ORAL_TABLET | ORAL | Status: DC | PRN

## 2019-02-10 MED ORDER — LACTATED RINGERS IV SOLN
INTRAVENOUS | Status: DC
  Administered 2019-02-10 – 2019-02-12 (×5): via INTRAVENOUS

## 2019-02-10 MED ORDER — ONDANSETRON HCL 4 MG/2ML IJ SOLN
4.0000 mg | Freq: Four times a day (QID) | INTRAMUSCULAR | Status: DC | PRN
  Administered 2019-02-11: 4 mg via INTRAVENOUS
  Filled 2019-02-10: qty 2

## 2019-02-10 MED ORDER — OXYCODONE-ACETAMINOPHEN 5-325 MG PO TABS: 2.0000 | ORAL_TABLET | ORAL | Status: DC | PRN

## 2019-02-10 MED ORDER — OXYTOCIN 40 UNITS IN NORMAL SALINE INFUSION - SIMPLE MED
2.5000 [IU]/h | INTRAVENOUS | Status: DC
  Filled 2019-02-10 (×2): qty 1000

## 2019-02-10 MED ORDER — OXYTOCIN 40 UNITS IN NORMAL SALINE INFUSION - SIMPLE MED
1.0000 m[IU]/min | INTRAVENOUS | Status: DC
  Administered 2019-02-10: 2 m[IU]/min via INTRAVENOUS
  Administered 2019-02-11: 1 m[IU]/min via INTRAVENOUS

## 2019-02-10 MED ORDER — LIDOCAINE HCL (PF) 1 % IJ SOLN
30.0000 mL | INTRAMUSCULAR | Status: DC | PRN
  Filled 2019-02-10: qty 30

## 2019-02-10 MED ORDER — OXYTOCIN BOLUS FROM INFUSION: 500.0000 mL | Freq: Once | INTRAVENOUS | Status: DC

## 2019-02-10 NOTE — Progress Notes (Signed)
33 y.o. [id number] [redacted]w[redacted]d HD#18 admitted for PTL and persistent fetal decels.  Over last 18 hours only variable decels were seen.  Pt currently stable with no c/o - still not feeling contractions.  Good FM.  No LOF or bleeding.   Vitals:   02/09/19 1815 02/09/19 1935 02/09/19 2344 02/10/19 0546  BP: 103/62 129/74 100/68 103/73  Pulse: 71 87 80 70  Resp: 17 17 17 17   Temp: 98.4 F (36.9 C) 98.3 F (36.8 C) 98.3 F (36.8 C) 98.3 F (36.8 C)  TempSrc: Oral Oral Oral Oral  SpO2: 99% 98% 99% 99%  Weight:      Height:        Lungs CTA Cor RRR Abd  Soft, gravid, nontender Ex SCDs FHTs  120s, good short term variability, NST R Toco  Continue q 3-10  No results found for this or any previous visit (from the past 24 hour(s)).  A:  HD#18  [redacted]w[redacted]d with Preterm labor, persistent prolonged decels and cerclage in place..  P: Pt here for observation for persistent decels.  The previous 24 hours saw an increase in the frequency but last 18 hours have been stable.  Pt continues to contract despite nifedipine. 1.  GBS neg.   2.  Nifedipine not really controlling contractions and pt is now 35 weeks- will d/c.  3.  Cerclage is due out at 36 weeks unless labor or NRFHTs. 4.  No need for rescue BMZ at this pt, pt received regular doses on admission. 5.  Last US done 7/20 was VTX with AFI 10 and BPP 8/8.  EFW last done 7-13- EFW 4#13, 13%ile.  Will repeat growth and BPP/AFI today.  Daria Pastures

## 2019-02-10 NOTE — Progress Notes (Signed)
Pt stable and not feeling contractions.  BPP was reassuring today but since then the fetus has had 3 decels, the last one was 7 minutes long in the 90s.  Although we had a period over the last 16 or so hours of no decels, again we are having more decels and now closer together.   At this point in time at 35 weeks s/p betamethasone, the risks to the fetus, although not negligible, are less than the risks to both mom and baby should a decel not recover and require an emergent c/s-  Risks to the neonate include TTN, challenges in keeping warm and possibly feeding while risks of emergent c/s include intubation, damage to internal organs, infection, DVT, and neonatal distress/metabolic acidosis.  I have discussed all these risks in detail with the patient and she agrees to proceed with delivery.  FHTs currently 150s, GSTV, NST R Toco q3  Cerclage removed - two knots cut together and no strings felt after.  Small nick of cervix by scissors was hemostatic with pressure.  Will begin pitocin 2x2 and I have encouraged the patient to get her epidural as soon as she may.  GBS is negative.

## 2019-02-10 NOTE — Progress Notes (Deleted)
Patient was C/C/+1 and pushed for 5 minutes with epidural.    NSVD  female infant, Apgars 9,9, weight P.   The patient had a midline first degree perineal laceration repaired with 2-0 vicryl R. Fundus was firm. EBL was expected amount. Placenta was delivered intact. Vagina was clear.  Delayed cord clamping done for 30-60 seconds while warming baby. Baby was vigorous and doing skin to skin with mother.  Carolyn Webb

## 2019-02-10 NOTE — Progress Notes (Signed)
[place] VTX, BPP 8/8, AFI now 18.  FHTs 120s, gSTV, NST R, noi decels seen.  Toco q3-4  Continue obs.

## 2019-02-10 NOTE — Progress Notes (Signed)
Off the monitor for Ultrasound and shower.

## 2019-02-11 ENCOUNTER — Inpatient Hospital Stay (HOSPITAL_COMMUNITY): Payer: 59 | Admitting: Anesthesiology

## 2019-02-11 LAB — CBC
HCT: 35 % — ABNORMAL LOW (ref 36.0–46.0)
Hemoglobin: 11.9 g/dL — ABNORMAL LOW (ref 12.0–15.0)
MCH: 32 pg (ref 26.0–34.0)
MCHC: 34 g/dL (ref 30.0–36.0)
MCV: 94.1 fL (ref 80.0–100.0)
Platelets: 237 10*3/uL (ref 150–400)
RBC: 3.72 MIL/uL — ABNORMAL LOW (ref 3.87–5.11)
RDW: 12.8 % (ref 11.5–15.5)
WBC: 5.1 10*3/uL (ref 4.0–10.5)
nRBC: 0 % (ref 0.0–0.2)

## 2019-02-11 LAB — RPR: RPR Ser Ql: NONREACTIVE

## 2019-02-11 MED ORDER — MISOPROSTOL 100 MCG PO TABS
25.0000 ug | ORAL_TABLET | ORAL | Status: DC
  Administered 2019-02-11 (×2): 25 ug via VAGINAL
  Filled 2019-02-11 (×3): qty 1

## 2019-02-11 MED ORDER — EPHEDRINE 5 MG/ML INJ: 10.0000 mg | INTRAVENOUS | Status: DC | PRN

## 2019-02-11 MED ORDER — SODIUM CHLORIDE (PF) 0.9 % IJ SOLN
INTRAMUSCULAR | Status: DC | PRN
  Administered 2019-02-11: 12 mL/h via EPIDURAL

## 2019-02-11 MED ORDER — PHENYLEPHRINE 40 MCG/ML (10ML) SYRINGE FOR IV PUSH (FOR BLOOD PRESSURE SUPPORT): 80.0000 ug | PREFILLED_SYRINGE | INTRAVENOUS | Status: DC | PRN

## 2019-02-11 MED ORDER — DIPHENHYDRAMINE HCL 50 MG/ML IJ SOLN: 12.5000 mg | INTRAMUSCULAR | Status: DC | PRN

## 2019-02-11 MED ORDER — FENTANYL-BUPIVACAINE-NACL 0.5-0.125-0.9 MG/250ML-% EP SOLN
12.0000 mL/h | EPIDURAL | Status: DC | PRN
  Administered 2019-02-12: 12 mL/h via EPIDURAL
  Filled 2019-02-11: qty 250

## 2019-02-11 MED ORDER — LIDOCAINE HCL (PF) 1 % IJ SOLN
INTRAMUSCULAR | Status: DC | PRN
  Administered 2019-02-11: 5 mL via EPIDURAL

## 2019-02-11 MED ORDER — FENTANYL-BUPIVACAINE-NACL 0.5-0.125-0.9 MG/250ML-% EP SOLN
12.0000 mL/h | EPIDURAL | Status: DC | PRN
  Filled 2019-02-11: qty 250

## 2019-02-11 MED ORDER — PHENYLEPHRINE 40 MCG/ML (10ML) SYRINGE FOR IV PUSH (FOR BLOOD PRESSURE SUPPORT)
80.0000 ug | PREFILLED_SYRINGE | INTRAVENOUS | Status: DC | PRN
  Filled 2019-02-11: qty 10

## 2019-02-11 MED ORDER — LACTATED RINGERS IV SOLN
500.0000 mL | Freq: Once | INTRAVENOUS | Status: AC
  Administered 2019-02-11: 10:00:00 500 mL via INTRAVENOUS

## 2019-02-11 MED ORDER — LACTATED RINGERS IV SOLN: 500.0000 mL | Freq: Once | INTRAVENOUS | Status: DC

## 2019-02-11 NOTE — Progress Notes (Signed)
Patient ID: Carolyn Webb, female   DOB: 1985/09/27, 33 y.o.   MRN: [id number]  Pt now comfortable with epidural Vitals:   02/11/19 1101 02/11/19 1104 02/11/19 1107 02/11/19 1110  BP: 120/76 117/72 124/74 115/70  Pulse: (!) 57 (!) 57 (!) 58 64  Resp:      Temp:      TempSrc:      SpO2:      Weight:      Height:       Aox3, NAD FHR 130 reactive. Will have occasional late appearing decels with 15 by 15 accelerations before and after Cvx FT with tight band of distal scar tissue  Per Dr. Philis Pique, she had concern for residual cerclage suture remaining in situ. I palpate 2 knot scars on the posterior aspect of the patients cervix. The distal scar was superficially cut with scissors after the placement of a speculum. No additional suture encountered or palpated  Will stop pit, place cytotec to see if this will assist in breaking down the scar tissue.  Overall FWB reassuring

## 2019-02-11 NOTE — Progress Notes (Signed)
Patient ID: Carolyn Webb, female   DOB: Aug 05, 1985, 33 y.o.   MRN: [id number]  Feeling pressure Vitals:   02/11/19 1801 02/11/19 1831 02/11/19 1901 02/11/19 1932  BP: 120/77 123/73 121/70 112/72  Pulse: 66 (!) 59 60 68  Resp:      Temp:      TempSrc:      SpO2:      Weight:      Height:       AOX3, NAD Cvx FT/70/-2, scar tissue band unchanged toco irreg 140 reactive, cat 1 tracing  Foley placed transcervically  Start pit 1 x 1

## 2019-02-11 NOTE — Anesthesia Procedure Notes (Signed)
Epidural Patient location during procedure: OB Start time: 02/11/2019 9:23 AM End time: 02/11/2019 9:29 AM  Staffing Anesthesiologist: Kynsie Falkner D, MD Performed: anesthesiologist   Preanesthetic Checklist Completed: patient identified, site marked, surgical consent, pre-op evaluation, timeout performed, IV checked, risks and benefits discussed and monitors and equipment checked  Epidural Patient position: sitting Prep: ChloraPrep Patient monitoring: heart rate, continuous pulse ox and blood pressure Approach: midline Location: L3-L4 Injection technique: LOR saline  Needle:  Needle type: Tuohy  Needle gauge: 17 G Needle length: 9 cm Catheter type: closed end flexible Catheter size: 20 Guage Test dose: negative and 1.5% lidocaine  Assessment Events: blood not aspirated, injection not painful, no injection resistance and no paresthesia  Additional Notes LOR @ 4.5  Patient identified. Risks/Benefits/Options discussed with patient including but not limited to bleeding, infection, nerve damage, paralysis, failed block, incomplete pain control, headache, blood pressure changes, nausea, vomiting, reactions to medications, itching and postpartum back pain. Confirmed with bedside nurse the patient's most recent platelet count. Confirmed with patient that they are not currently taking any anticoagulation, have any bleeding history or any family history of bleeding disorders. Patient expressed understanding and wished to proceed. All questions were answered. Sterile technique was used throughout the entire procedure. Please see nursing notes for vital signs. Test dose was given through epidural catheter and negative prior to continuing to dose epidural or start infusion. Warning signs of high block given to the patient including shortness of breath, tingling/numbness in hands, complete motor block, or any concerning symptoms with instructions to call for help. Patient was given instructions  on fall risk and not to get out of bed. All questions and concerns addressed with instructions to call with any issues or inadequate analgesia.    Reason for block:procedure for pain     

## 2019-02-11 NOTE — Progress Notes (Signed)
Cervix evaluated by MD for additional cerclage stitch.  None found on physical exam or visually with SSE.

## 2019-02-11 NOTE — Anesthesia Preprocedure Evaluation (Addendum)
Anesthesia Evaluation  Patient identified by MRN, date of birth, ID band Patient awake    Reviewed: Allergy & Precautions, NPO status , Patient's Chart, lab work & pertinent test results  History of Anesthesia Complications Negative for: history of anesthetic complications  Airway Mallampati: I       Dental no notable dental hx.    Pulmonary    Pulmonary exam normal        Cardiovascular negative cardio ROS Normal cardiovascular exam     Neuro/Psych    GI/Hepatic Neg liver ROS,   Endo/Other    Renal/GU      Musculoskeletal   Abdominal   Peds  Hematology negative hematology ROS (+)   Anesthesia Other Findings   Reproductive/Obstetrics (+) Pregnancy                            Anesthesia Physical Anesthesia Plan  ASA: II  Anesthesia Plan: Epidural   Post-op Pain Management:    Induction:   PONV Risk Score and Plan: 3 and Treatment may vary due to age or medical condition, Ondansetron and Dexamethasone  Airway Management Planned: Natural Airway  Additional Equipment: None  Intra-op Plan:   Post-operative Plan:   Informed Consent: I have reviewed the patients History and Physical, chart, labs and discussed the procedure including the risks, benefits and alternatives for the proposed anesthesia with the patient or authorized representative who has indicated his/her understanding and acceptance.     Dental advisory given  Plan Discussed with: CRNA  Anesthesia Plan Comments: (Lab Results      Component                Value               Date                      WBC                      5.1                 02/11/2019                HGB                      11.9 (L)            02/11/2019                HCT                      35.0 (L)            02/11/2019                MCV                      94.1                02/11/2019                PLT                      237                  02/11/2019            COVID-19 Labs  No results for  input(s): DDIMER, FERRITIN, LDH, CRP in the last 72 hours.  Lab Results      Component                Value               Date                      [id number]              NEGATIVE            01/23/2019            )       Anesthesia Quick Evaluation

## 2019-02-12 ENCOUNTER — Inpatient Hospital Stay (HOSPITAL_COMMUNITY): Payer: 59 | Admitting: Anesthesiology

## 2019-02-12 ENCOUNTER — Inpatient Hospital Stay (HOSPITAL_BASED_OUTPATIENT_CLINIC_OR_DEPARTMENT_OTHER): Payer: 59

## 2019-02-12 ENCOUNTER — Encounter (HOSPITAL_COMMUNITY)
Admission: AD | Disposition: A | Discharge status: Home / Self Care | Payer: Self-pay | Source: Home / Self Care | Attending: Obstetrics and Gynecology

## 2019-02-12 DIAGNOSIS — Z3A35 35 weeks gestation of pregnancy: Secondary | ICD-10-CM | POA: Diagnosis not present

## 2019-02-12 DIAGNOSIS — O3433 Maternal care for cervical incompetence, third trimester: Secondary | ICD-10-CM | POA: Diagnosis not present

## 2019-02-12 DIAGNOSIS — Z3490 Encounter for supervision of normal pregnancy, unspecified, unspecified trimester: Secondary | ICD-10-CM

## 2019-02-12 DIAGNOSIS — O09213 Supervision of pregnancy with history of pre-term labor, third trimester: Secondary | ICD-10-CM | POA: Diagnosis not present

## 2019-02-12 HISTORY — PX: CERVICAL CERCLAGE: SHX1329

## 2019-02-12 LAB — CBC
HCT: 32.1 % — ABNORMAL LOW (ref 36.0–46.0)
Hemoglobin: 11 g/dL — ABNORMAL LOW (ref 12.0–15.0)
MCH: 31.9 pg (ref 26.0–34.0)
MCHC: 34.3 g/dL (ref 30.0–36.0)
MCV: 93 fL (ref 80.0–100.0)
Platelets: 196 10*3/uL (ref 150–400)
RBC: 3.45 MIL/uL — ABNORMAL LOW (ref 3.87–5.11)
RDW: 12.7 % (ref 11.5–15.5)
WBC: 10.8 10*3/uL — ABNORMAL HIGH (ref 4.0–10.5)
nRBC: 0 % (ref 0.0–0.2)

## 2019-02-12 LAB — TYPE AND SCREEN
ABO/RH(D): A POS
Antibody Screen: NEGATIVE

## 2019-02-12 SURGERY — Surgical Case
Anesthesia: Epidural | Wound class: Clean Contaminated

## 2019-02-12 SURGERY — CERCLAGE, CERVIX, VAGINAL APPROACH
Anesthesia: Epidural

## 2019-02-12 MED ORDER — OXYTOCIN BOLUS FROM INFUSION: 500.0000 mL | Freq: Once | INTRAVENOUS | Status: DC

## 2019-02-12 MED ORDER — NALBUPHINE HCL 10 MG/ML IJ SOLN: 5.0000 mg | Freq: Once | INTRAMUSCULAR | Status: DC | PRN

## 2019-02-12 MED ORDER — KETOROLAC TROMETHAMINE 30 MG/ML IJ SOLN
INTRAMUSCULAR | Status: AC
  Filled 2019-02-12: qty 1

## 2019-02-12 MED ORDER — SODIUM CHLORIDE 0.9 % IV SOLN
2.0000 g | Freq: Four times a day (QID) | INTRAVENOUS | Status: DC
  Administered 2019-02-12 (×2): 2 g via INTRAVENOUS
  Filled 2019-02-12 (×3): qty 2000

## 2019-02-12 MED ORDER — LACTATED RINGERS IV SOLN: INTRAVENOUS | Status: DC

## 2019-02-12 MED ORDER — LIDOCAINE HCL (PF) 1 % IJ SOLN
30.0000 mL | INTRAMUSCULAR | Status: AC
  Administered 2019-02-12: 08:00:00 30 mL

## 2019-02-12 MED ORDER — OXYTOCIN 40 UNITS IN NORMAL SALINE INFUSION - SIMPLE MED: 2.5000 [IU]/h | INTRAVENOUS | Status: AC

## 2019-02-12 MED ORDER — ACETAMINOPHEN 160 MG/5ML PO SOLN: 1000.0000 mg | Freq: Once | ORAL | Status: DC

## 2019-02-12 MED ORDER — SENNOSIDES-DOCUSATE SODIUM 8.6-50 MG PO TABS
2.0000 | ORAL_TABLET | ORAL | Status: DC
  Administered 2019-02-13 – 2019-02-15 (×4): 2 via ORAL
  Filled 2019-02-12 (×4): qty 2

## 2019-02-12 MED ORDER — KETOROLAC TROMETHAMINE 30 MG/ML IJ SOLN
30.0000 mg | Freq: Four times a day (QID) | INTRAMUSCULAR | Status: AC | PRN
  Administered 2019-02-12: 30 mg via INTRAMUSCULAR

## 2019-02-12 MED ORDER — KETOROLAC TROMETHAMINE 30 MG/ML IJ SOLN: 30.0000 mg | Freq: Four times a day (QID) | INTRAMUSCULAR | Status: AC | PRN

## 2019-02-12 MED ORDER — FENTANYL-BUPIVACAINE-NACL 0.5-0.125-0.9 MG/250ML-% EP SOLN
12.0000 mL/h | EPIDURAL | Status: DC | PRN
  Administered 2019-02-12: 12 mL/h via EPIDURAL
  Filled 2019-02-12: qty 250

## 2019-02-12 MED ORDER — SODIUM CHLORIDE 0.9 % IR SOLN
Status: DC | PRN
  Administered 2019-02-12: 1

## 2019-02-12 MED ORDER — FENTANYL CITRATE (PF) 100 MCG/2ML IJ SOLN
INTRAMUSCULAR | Status: DC | PRN
  Administered 2019-02-12 (×2): 100 ug via EPIDURAL

## 2019-02-12 MED ORDER — ACETAMINOPHEN 500 MG PO TABS: 1000.0000 mg | ORAL_TABLET | Freq: Once | ORAL | Status: DC

## 2019-02-12 MED ORDER — KETOROLAC TROMETHAMINE 30 MG/ML IJ SOLN: 30.0000 mg | Freq: Once | INTRAMUSCULAR | Status: DC

## 2019-02-12 MED ORDER — ACETAMINOPHEN 325 MG PO TABS
650.0000 mg | ORAL_TABLET | ORAL | Status: DC | PRN
  Administered 2019-02-12: 650 mg via ORAL
  Filled 2019-02-12: qty 2

## 2019-02-12 MED ORDER — METHYLERGONOVINE MALEATE 0.2 MG/ML IJ SOLN
INTRAMUSCULAR | Status: AC
  Filled 2019-02-12: qty 1

## 2019-02-12 MED ORDER — LIDOCAINE HCL (PF) 1 % IJ SOLN: 30.0000 mL | INTRAMUSCULAR | Status: DC | PRN

## 2019-02-12 MED ORDER — ONDANSETRON HCL 4 MG/2ML IJ SOLN: 4.0000 mg | Freq: Four times a day (QID) | INTRAMUSCULAR | Status: DC | PRN

## 2019-02-12 MED ORDER — ACETAMINOPHEN 650 MG RE SUPP
650.0000 mg | RECTAL | Status: DC | PRN
  Administered 2019-02-12: 650 mg via RECTAL
  Filled 2019-02-12 (×2): qty 1

## 2019-02-12 MED ORDER — TERBUTALINE SULFATE 1 MG/ML IJ SOLN
INTRAMUSCULAR | Status: AC
  Administered 2019-02-12: 0.25 mg via SUBCUTANEOUS
  Filled 2019-02-12: qty 1

## 2019-02-12 MED ORDER — ONDANSETRON HCL 4 MG/2ML IJ SOLN
4.0000 mg | Freq: Three times a day (TID) | INTRAMUSCULAR | Status: DC | PRN
  Administered 2019-02-13: 4 mg via INTRAVENOUS
  Filled 2019-02-12: qty 2

## 2019-02-12 MED ORDER — OXYTOCIN 40 UNITS IN NORMAL SALINE INFUSION - SIMPLE MED: 2.5000 [IU]/h | INTRAVENOUS | Status: DC

## 2019-02-12 MED ORDER — SODIUM CHLORIDE 0.9 % IV SOLN
INTRAVENOUS | Status: DC | PRN
  Administered 2019-02-12: 21:00:00 via INTRAVENOUS

## 2019-02-12 MED ORDER — LIDOCAINE-EPINEPHRINE 2 %-1:100000 IJ SOLN
INTRAMUSCULAR | Status: DC | PRN
  Administered 2019-02-12 (×4): 5 mL via INTRADERMAL

## 2019-02-12 MED ORDER — ZOLPIDEM TARTRATE 5 MG PO TABS: 5.0000 mg | ORAL_TABLET | Freq: Every evening | ORAL | Status: DC | PRN

## 2019-02-12 MED ORDER — ONDANSETRON HCL 4 MG/2ML IJ SOLN
INTRAMUSCULAR | Status: DC | PRN
  Administered 2019-02-12: 4 mg via INTRAVENOUS

## 2019-02-12 MED ORDER — FENTANYL CITRATE (PF) 100 MCG/2ML IJ SOLN
100.0000 ug | Freq: Once | INTRAMUSCULAR | Status: DC
  Filled 2019-02-12: qty 2

## 2019-02-12 MED ORDER — SIMETHICONE 80 MG PO CHEW
80.0000 mg | CHEWABLE_TABLET | ORAL | Status: DC
  Administered 2019-02-13 – 2019-02-15 (×4): 80 mg via ORAL
  Filled 2019-02-12 (×4): qty 1

## 2019-02-12 MED ORDER — MEASLES, MUMPS & RUBELLA VAC IJ SOLR: 0.5000 mL | Freq: Once | INTRAMUSCULAR | Status: DC

## 2019-02-12 MED ORDER — NALBUPHINE HCL 10 MG/ML IJ SOLN: 5.0000 mg | INTRAMUSCULAR | Status: DC | PRN

## 2019-02-12 MED ORDER — WITCH HAZEL-GLYCERIN EX PADS: 1.0000 "application " | MEDICATED_PAD | CUTANEOUS | Status: DC | PRN

## 2019-02-12 MED ORDER — DEXAMETHASONE SODIUM PHOSPHATE 10 MG/ML IJ SOLN
INTRAMUSCULAR | Status: AC
  Filled 2019-02-12: qty 1

## 2019-02-12 MED ORDER — PRENATAL MULTIVITAMIN CH
1.0000 | ORAL_TABLET | Freq: Every day | ORAL | Status: DC
  Administered 2019-02-13 – 2019-02-16 (×4): 1 via ORAL
  Filled 2019-02-12 (×4): qty 1

## 2019-02-12 MED ORDER — OXYTOCIN 40 UNITS IN NORMAL SALINE INFUSION - SIMPLE MED: 1.0000 m[IU]/min | INTRAVENOUS | Status: DC

## 2019-02-12 MED ORDER — DIBUCAINE (PERIANAL) 1 % EX OINT: 1.0000 "application " | TOPICAL_OINTMENT | CUTANEOUS | Status: DC | PRN

## 2019-02-12 MED ORDER — OXYCODONE-ACETAMINOPHEN 5-325 MG PO TABS
1.0000 | ORAL_TABLET | ORAL | Status: DC | PRN
  Filled 2019-02-12: qty 2

## 2019-02-12 MED ORDER — OXYCODONE-ACETAMINOPHEN 5-325 MG PO TABS: 1.0000 | ORAL_TABLET | ORAL | Status: DC | PRN

## 2019-02-12 MED ORDER — PROMETHAZINE HCL 25 MG/ML IJ SOLN: 6.2500 mg | INTRAMUSCULAR | Status: DC | PRN

## 2019-02-12 MED ORDER — TETANUS-DIPHTH-ACELL PERTUSSIS 5-2.5-18.5 LF-MCG/0.5 IM SUSP: 0.5000 mL | Freq: Once | INTRAMUSCULAR | Status: DC

## 2019-02-12 MED ORDER — LIDOCAINE-EPINEPHRINE (PF) 2 %-1:200000 IJ SOLN
INTRAMUSCULAR | Status: AC
  Filled 2019-02-12: qty 20

## 2019-02-12 MED ORDER — LACTATED RINGERS IV SOLN
INTRAVENOUS | Status: DC
  Administered 2019-02-12 (×2): via INTRAVENOUS

## 2019-02-12 MED ORDER — DEXAMETHASONE SODIUM PHOSPHATE 10 MG/ML IJ SOLN
INTRAMUSCULAR | Status: DC | PRN
  Administered 2019-02-12: 10 mg via INTRAVENOUS

## 2019-02-12 MED ORDER — ONDANSETRON HCL 4 MG/2ML IJ SOLN
INTRAMUSCULAR | Status: AC
  Filled 2019-02-12: qty 2

## 2019-02-12 MED ORDER — MORPHINE SULFATE (PF) 0.5 MG/ML IJ SOLN
INTRAMUSCULAR | Status: AC
  Filled 2019-02-12: qty 10

## 2019-02-12 MED ORDER — LIDOCAINE-EPINEPHRINE (PF) 2 %-1:200000 IJ SOLN
INTRAMUSCULAR | Status: DC | PRN
  Administered 2019-02-12 (×3): 5 mL via EPIDURAL

## 2019-02-12 MED ORDER — SODIUM CHLORIDE 0.9% FLUSH: 3.0000 mL | INTRAVENOUS | Status: DC | PRN

## 2019-02-12 MED ORDER — DIPHENHYDRAMINE HCL 50 MG/ML IJ SOLN: 12.5000 mg | INTRAMUSCULAR | Status: DC | PRN

## 2019-02-12 MED ORDER — CEFAZOLIN SODIUM-DEXTROSE 2-3 GM-%(50ML) IV SOLR
INTRAVENOUS | Status: DC | PRN
  Administered 2019-02-12: 2 g via INTRAVENOUS

## 2019-02-12 MED ORDER — OXYTOCIN 40 UNITS IN NORMAL SALINE INFUSION - SIMPLE MED
1.0000 m[IU]/min | INTRAVENOUS | Status: DC
  Administered 2019-02-12: 1 m[IU]/min via INTRAVENOUS
  Filled 2019-02-12: qty 1000

## 2019-02-12 MED ORDER — OXYTOCIN 40 UNITS IN NORMAL SALINE INFUSION - SIMPLE MED
INTRAVENOUS | Status: AC
  Filled 2019-02-12: qty 1000

## 2019-02-12 MED ORDER — SODIUM CHLORIDE 0.9 % IV SOLN
INTRAVENOUS | Status: DC | PRN
  Administered 2019-02-12: 21:00:00 40 [IU] via INTRAVENOUS

## 2019-02-12 MED ORDER — FENTANYL CITRATE (PF) 100 MCG/2ML IJ SOLN
INTRAMUSCULAR | Status: AC
  Filled 2019-02-12: qty 2

## 2019-02-12 MED ORDER — FENTANYL CITRATE (PF) 100 MCG/2ML IJ SOLN: 25.0000 ug | INTRAMUSCULAR | Status: DC | PRN

## 2019-02-12 MED ORDER — NALOXONE HCL 4 MG/10ML IJ SOLN
1.0000 ug/kg/h | INTRAVENOUS | Status: DC | PRN
  Filled 2019-02-12: qty 5

## 2019-02-12 MED ORDER — OXYCODONE-ACETAMINOPHEN 5-325 MG PO TABS: 2.0000 | ORAL_TABLET | ORAL | Status: DC | PRN

## 2019-02-12 MED ORDER — MORPHINE SULFATE (PF) 0.5 MG/ML IJ SOLN
INTRAMUSCULAR | Status: DC | PRN
  Administered 2019-02-12: 2 mg via EPIDURAL

## 2019-02-12 MED ORDER — BUPIVACAINE-EPINEPHRINE (PF) 0.25% -1:200000 IJ SOLN
INTRAMUSCULAR | Status: DC | PRN
  Administered 2019-02-12: 8 mL

## 2019-02-12 MED ORDER — ACETAMINOPHEN 500 MG PO TABS
1000.0000 mg | ORAL_TABLET | Freq: Four times a day (QID) | ORAL | Status: AC
  Administered 2019-02-13 (×3): 1000 mg via ORAL
  Filled 2019-02-12 (×3): qty 2

## 2019-02-12 MED ORDER — NALOXONE HCL 0.4 MG/ML IJ SOLN: 0.4000 mg | INTRAMUSCULAR | Status: DC | PRN

## 2019-02-12 MED ORDER — DIPHENHYDRAMINE HCL 25 MG PO CAPS: 25.0000 mg | ORAL_CAPSULE | ORAL | Status: DC | PRN

## 2019-02-12 MED ORDER — CEFAZOLIN SODIUM-DEXTROSE 2-4 GM/100ML-% IV SOLN
INTRAVENOUS | Status: AC
  Filled 2019-02-12: qty 100

## 2019-02-12 MED ORDER — FLEET ENEMA 7-19 GM/118ML RE ENEM: 1.0000 | ENEMA | RECTAL | Status: DC | PRN

## 2019-02-12 MED ORDER — SOD CITRATE-CITRIC ACID 500-334 MG/5ML PO SOLN
30.0000 mL | ORAL | Status: DC | PRN
  Administered 2019-02-12: 30 mL via ORAL
  Filled 2019-02-12: qty 30

## 2019-02-12 MED ORDER — LACTATED RINGERS IV SOLN: 500.0000 mL | INTRAVENOUS | Status: DC | PRN

## 2019-02-12 MED ORDER — COCONUT OIL OIL: 1.0000 "application " | TOPICAL_OIL | Status: DC | PRN

## 2019-02-12 SURGICAL SUPPLY — 29 items
BENZOIN TINCTURE PRP APPL 2/3 (GAUZE/BANDAGES/DRESSINGS) ×2 IMPLANT
CHLORAPREP W/TINT 26ML (MISCELLANEOUS) ×3 IMPLANT
CLAMP CORD UMBIL (MISCELLANEOUS) IMPLANT
CLOSURE WOUND 1/4 X3 (GAUZE/BANDAGES/DRESSINGS) ×1
CLOTH BEACON ORANGE TIMEOUT ST (SAFETY) ×3 IMPLANT
DRSG OPSITE POSTOP 4X10 (GAUZE/BANDAGES/DRESSINGS) ×3 IMPLANT
ELECT REM PT RETURN 9FT ADLT (ELECTROSURGICAL) ×3
ELECTRODE REM PT RTRN 9FT ADLT (ELECTROSURGICAL) ×1 IMPLANT
EXTRACTOR VACUUM M CUP 4 TUBE (SUCTIONS) IMPLANT
EXTRACTOR VACUUM M CUP 4' TUBE (SUCTIONS)
GLOVE BIOGEL PI IND STRL 7.0 (GLOVE) ×1 IMPLANT
GLOVE BIOGEL PI INDICATOR 7.0 (GLOVE) ×2
GLOVE ECLIPSE 7.0 STRL STRAW (GLOVE) ×6 IMPLANT
GOWN STRL REUS W/TWL LRG LVL3 (GOWN DISPOSABLE) ×6 IMPLANT
KIT ABG SYR 3ML LUER SLIP (SYRINGE) IMPLANT
NDL HYPO 25X5/8 SAFETYGLIDE (NEEDLE) IMPLANT
NEEDLE HYPO 25X5/8 SAFETYGLIDE (NEEDLE) IMPLANT
NS IRRIG 1000ML POUR BTL (IV SOLUTION) ×3 IMPLANT
PACK C SECTION WH (CUSTOM PROCEDURE TRAY) ×3 IMPLANT
PAD OB MATERNITY 4.3X12.25 (PERSONAL CARE ITEMS) ×3 IMPLANT
STRIP CLOSURE SKIN 1/4X3 (GAUZE/BANDAGES/DRESSINGS) ×1 IMPLANT
SUT MNCRL 0 VIOLET CTX 36 (SUTURE) ×3 IMPLANT
SUT MON AB 2-0 CT1 27 (SUTURE) ×6 IMPLANT
SUT MONOCRYL 0 CTX 36 (SUTURE) ×6
SUT PLAIN 0 NONE (SUTURE) IMPLANT
SUT VIC AB 4-0 KS 27 (SUTURE) ×2 IMPLANT
TOWEL OR 17X24 6PK STRL BLUE (TOWEL DISPOSABLE) ×3 IMPLANT
TRAY FOLEY W/BAG SLVR 14FR LF (SET/KITS/TRAYS/PACK) IMPLANT
WATER STERILE IRR 1000ML POUR (IV SOLUTION) ×3 IMPLANT

## 2019-02-12 SURGICAL SUPPLY — 23 items
BLADE SURG 11 STRL SS (BLADE) ×3 IMPLANT
CANISTER SUCT 3000ML PPV (MISCELLANEOUS) ×3 IMPLANT
CATH FOLEY 2WAY SLVR 30CC 16FR (CATHETERS) IMPLANT
ELECT REM PT RETURN 9FT ADLT (ELECTROSURGICAL) ×3
ELECTRODE REM PT RTRN 9FT ADLT (ELECTROSURGICAL) ×1 IMPLANT
GLOVE BIO SURGEON STRL SZ7 (GLOVE) ×3 IMPLANT
GLOVE BIOGEL PI IND STRL 7.0 (GLOVE) ×1 IMPLANT
GLOVE BIOGEL PI INDICATOR 7.0 (GLOVE) ×2
GOWN STRL REUS W/TWL LRG LVL3 (GOWN DISPOSABLE) ×6 IMPLANT
NDL MAYO CATGUT SZ4 TPR NDL (NEEDLE) ×1 IMPLANT
NEEDLE MAYO CATGUT SZ4 (NEEDLE) ×3 IMPLANT
NS IRRIG 1000ML POUR BTL (IV SOLUTION) ×3 IMPLANT
PACK VAGINAL MINOR WOMEN LF (CUSTOM PROCEDURE TRAY) ×3 IMPLANT
PAD OB MATERNITY 4.3X12.25 (PERSONAL CARE ITEMS) ×3 IMPLANT
PAD PREP 24X48 CUFFED NSTRL (MISCELLANEOUS) ×3 IMPLANT
PENCIL BUTTON HOLSTER BLD 10FT (ELECTRODE) ×3 IMPLANT
SUT TICRON 2 BLUE 36 GS-21 (SUTURE) ×6 IMPLANT
SYR 30ML LL (SYRINGE) IMPLANT
TOWEL OR 17X24 6PK STRL BLUE (TOWEL DISPOSABLE) ×6 IMPLANT
TRAY FOLEY W/BAG SLVR 14FR (SET/KITS/TRAYS/PACK) ×3 IMPLANT
TUBING NON-CON 1/4 X 20 CONN (TUBING) ×2 IMPLANT
TUBING NON-CON 1/4 X 20' CONN (TUBING) ×1
YANKAUER SUCT BULB TIP NO VENT (SUCTIONS) ×3 IMPLANT

## 2019-02-12 NOTE — Progress Notes (Signed)
Exam of patient with epidural, stadol, and lidocaine local to cervix.  Pitocin was turned off.  A knot of ticron was seen posteriorly and I was able to cut one side of the stitch and remove it.  The cervix expanded to 3 cm.  However, there still seems to be a tight localized band in the area of the stitches.  When the first stitch was removed, both sides of the knot were cut.  I am worried that the ticron is still in the cervix from the first stitch.  I was unable to see anything posteriorly so I injected the bladder flap in the area of the previous scar and incised it transversely with metzenbaums.  I then retracted the bladder flap bluntly and sharply with the scissors.  I felt several times a string like area but could not see it.  After multiple attempts to locate the string, I abandoned the procedure.  Formula was placed in the foley (about 60 cc) and I watched the bladder flap area- no milk came from this area at all.    At the patient's intelligent request, we will get an ultrasound of the cervix to see if the stitch is still present.  If so, the pt is NPO now and will bring pt to OR for spinal dose of epidural and will cut the cervix vertically to break stitch.  If bleeding is present we may stitch it then- if not, we will leave open for labor and reevaluate pp.    With the dramatic change in cervix with the removal of the other stitch, I feel that vaginal delivery is still very likely.  FHTs 160s, gSTV, NST R, early decels, cat 1 tracing. Toco q 4-10  Will keep pitocin off for now.

## 2019-02-12 NOTE — Transfer of Care (Signed)
Immediate Anesthesia Transfer of Care Note  Patient: Carolyn Webb  Procedure(s) Performed: CERCLAGE CERVICAL (N/A )  Patient Location: PACU  Anesthesia Type:Epidural  Level of Consciousness: awake  Airway & Oxygen Therapy: Patient Spontanous Breathing  Post-op Assessment: Report given to RN  Post vital signs: Reviewed and stable  Last Vitals:  Vitals Value Taken Time  BP    Temp    Pulse    Resp    SpO2      Last Pain:  Vitals:   02/12/19 0500  TempSrc: Oral  PainSc:       Patients Stated Pain Goal: 0 (19/41/74 0814)  Complications: No apparent anesthesia complications

## 2019-02-12 NOTE — Anesthesia Postprocedure Evaluation (Signed)
Anesthesia Post Note  Patient: [name]  Procedure(s) Performed: CESAREAN SECTION (N/A )     Patient location during evaluation: PACU Anesthesia Type: Epidural Level of consciousness: awake and alert Pain management: pain level controlled Vital Signs Assessment: post-procedure vital signs reviewed and stable Respiratory status: spontaneous breathing, nonlabored ventilation and respiratory function stable Cardiovascular status: blood pressure returned to baseline and stable Postop Assessment: no apparent nausea or vomiting and epidural receding Anesthetic complications: no    Last Vitals:  Vitals:   02/12/19 2230 02/12/19 2245  BP: 133/79 121/63  Pulse: 97   Resp: 17 12  Temp:  36.8 C  SpO2: 98%     Last Pain:  Vitals:   02/12/19 2230  TempSrc:   PainSc: 0-No pain   Pain Goal: Patients Stated Pain Goal: 0 (02/06/19 0820)              Epidural/Spinal Function Cutaneous sensation: Normal sensation (02/12/19 2230), Patient able to flex knees: Yes (02/12/19 2230), Patient able to lift hips off bed: Yes (02/12/19 2230), Back pain beyond tenderness at insertion site: No (02/12/19 2230), Progressively worsening motor and/or sensory loss: No (02/12/19 2230), Bowel and/or bladder incontinence post epidural: No (02/12/19 2230)  Brennan Bailey

## 2019-02-12 NOTE — Anesthesia Preprocedure Evaluation (Signed)

## 2019-02-12 NOTE — Progress Notes (Signed)
Pt asked to have a c/s at 7 pm. Asked pt to wait until 8 pm and if still without change will proceed. No cx change. MDUs reported to be >200 for 6 hours. The FHTs are cat 2 Some mild elevation in FHTs. No decels.  Discussed c/s with pt and spouse Will proceed to OR.

## 2019-02-12 NOTE — Progress Notes (Signed)
Removal of cerclage stitch by M. Philis Pique, MD.

## 2019-02-12 NOTE — Op Note (Signed)
01/23/2019 - 02/12/2019  11:51 AM  PATIENT:  Carolyn Webb  33 y.o. female  PRE-OPERATIVE DIAGNOSIS:  cerclage removal  POST-OPERATIVE DIAGNOSIS:  Same, plus repair of cervix  PROCEDURE:  Procedure(s): CERCLAGE CERVICAL (N/A)  SURGEON:  Surgeon(s) and Role:    Bobbye Charleston, MD - Primary  ANESTHESIA:   epidural  EBL:  263 mL   SPECIMEN:  No Specimen  DISPOSITION OF SPECIMEN:  N/A  COUNTS:  YES  TOURNIQUET:  * No tourniquets in log *  DICTATION: .Note written in EPIC  PLAN OF CARE: Admit to inpatient   PATIENT DISPOSITION:  PACU - hemodynamically stable.   Delay start of Pharmacological VTE agent (>24hrs) due to surgical blood loss or risk of bleeding: not applicable  After adequate epidural anesthesia was achieved, the pt was prepped and draped in the usual sterile fashion.    A speculum was placed in the vagina and the previous anterior dissection was oozing but not bleeding actively.  The stitch could not be palpated or seen anteriorly.  Posteriorly, the stitch felt like it was just below the cevical mucosa.  Strully's were then used to cut the cervix vertically in this area and immediately a release was felt.  The end of the stitch could be seen and this was grasped with a forceps and the stitch pulled out.  The entire stitch and knot was removed.  This area where the mucosa has been cut was bleeding and hemostasis was achieved with 2-0 vicryl with 2 figure of eight stitches.  A small amount of oozing was seen later but remained very slight.\  Anteriorly the dissection from earlier was inspected and was bleeding more now.  Before repair however, milk was again instilled into bladder.  After manipulation of area, baby's head and expanding the bladder with almost 200 cc of milk fluid, no sign of it was seen coming anywhere from the superior dissection.  The superior dissection of the bladder flap was then closed with 2-0 vicryl with two stitches.  Mild oozing from  the area was all that was seen at this point.   All instruments were removed from the vagina and the patient returned to L&D with her cervix now 5 cm and soft.  The pt tolerated the procedure well.

## 2019-02-12 NOTE — Anesthesia Postprocedure Evaluation (Signed)
Anesthesia Post Note  Patient: Carolyn Webb  Procedure(s) Performed: CERCLAGE CERVICAL (N/A )     Patient location during evaluation: Mother Baby Anesthesia Type: Epidural Level of consciousness: awake and alert Pain management: pain level controlled Vital Signs Assessment: post-procedure vital signs reviewed and stable Respiratory status: spontaneous breathing, nonlabored ventilation and respiratory function stable Cardiovascular status: stable Postop Assessment: no headache, no backache and epidural receding Anesthetic complications: no    Last Vitals:  Vitals:   02/12/19 1331 02/12/19 1418  BP: (!) 103/45   Pulse: 90   Resp:    Temp:  37.2 C  SpO2:      Last Pain:  Vitals:   02/12/19 0500  TempSrc: Oral  PainSc:    Pain Goal: Patients Stated Pain Goal: 0 (02/06/19 0820)                 Kaidon Kinker

## 2019-02-12 NOTE — Brief Op Note (Signed)
01/23/2019 - 02/12/2019  11:51 AM  PATIENT:  Carolyn Webb  33 y.o. female  PRE-OPERATIVE DIAGNOSIS:  cerclage removal  POST-OPERATIVE DIAGNOSIS:  Same, plus repair of cervix  PROCEDURE:  Procedure(s): CERCLAGE CERVICAL (N/A)  SURGEON:  Surgeon(s) and Role:    Bobbye Charleston, MD - Primary  ANESTHESIA:   epidural  EBL:  263 mL   SPECIMEN:  No Specimen  DISPOSITION OF SPECIMEN:  N/A  COUNTS:  YES  TOURNIQUET:  * No tourniquets in log *  DICTATION: .Note written in EPIC  PLAN OF CARE: Admit to inpatient   PATIENT DISPOSITION:  PACU - hemodynamically stable.   Delay start of Pharmacological VTE agent (>24hrs) due to surgical blood loss or risk of bleeding: not applicable

## 2019-02-12 NOTE — Transfer of Care (Signed)
Immediate Anesthesia Transfer of Care Note  Patient: Carolyn Webb  Procedure(s) Performed: CESAREAN SECTION (N/A )  Patient Location: PACU  Anesthesia Type:Epidural  Level of Consciousness: awake, alert  and oriented  Airway & Oxygen Therapy: Patient Spontanous Breathing  Post-op Assessment: Report given to RN and Post -op Vital signs reviewed and stable  Post vital signs: Reviewed and stable  Last Vitals:  Vitals Value Taken Time  BP    Temp    Pulse    Resp    SpO2      Last Pain:  Vitals:   02/12/19 2029  TempSrc: Oral  PainSc: 0-No pain      Patients Stated Pain Goal: 0 (69/48/54 6270)  Complications: No apparent anesthesia complications

## 2019-02-13 ENCOUNTER — Encounter (HOSPITAL_COMMUNITY): Payer: Self-pay | Admitting: *Deleted

## 2019-02-13 LAB — CBC
HCT: 27.5 % — ABNORMAL LOW (ref 36.0–46.0)
Hemoglobin: 9.4 g/dL — ABNORMAL LOW (ref 12.0–15.0)
MCH: 31.9 pg (ref 26.0–34.0)
MCHC: 34.2 g/dL (ref 30.0–36.0)
MCV: 93.2 fL (ref 80.0–100.0)
Platelets: 181 10*3/uL (ref 150–400)
RBC: 2.95 MIL/uL — ABNORMAL LOW (ref 3.87–5.11)
RDW: 12.7 % (ref 11.5–15.5)
WBC: 12.6 10*3/uL — ABNORMAL HIGH (ref 4.0–10.5)
nRBC: 0 % (ref 0.0–0.2)

## 2019-02-13 LAB — RPR: RPR Ser Ql: NONREACTIVE

## 2019-02-13 NOTE — Progress Notes (Signed)
I offered follow up support to Carolyn Webb after the birth of her baby Felix Ahmadi.  She was grateful for the support and is so grateful that her delivery was healthy and that her baby is doing well.  We talked briefly about having a baby after a loss, and she stated that now that throughout the pregnancy, she was very anxious but now that he is here, she is doing much better; .   [place], [place], [phone] 5:14 PM     02/13/19 1700  Clinical Encounter Type  Visited With Patient  Visit Type Follow-up

## 2019-02-13 NOTE — Progress Notes (Signed)
Patient is eating, ambulating, foley in place.  Pain control is good.  Appropriate lochia, no complaints.  Vitals:   02/13/19 0233 02/13/19 0356 02/13/19 0604 02/13/19 0607  BP: 113/70 94/81 126/81   Pulse: 64 64 (!) 50 60  Resp: 17 16 12    Temp: 97.8 F (36.6 C)  97.7 F (36.5 C)   TempSrc: Oral Oral Oral   SpO2: 100% 100% 100%   Weight:      Height:        Fundus firm Abd: soft, nontender Inc: c/d/i Ext: no calf tenderness, SCDs on  Lab Results  Component Value Date   WBC 12.6 (H) 02/13/2019   HGB 9.4 (L) 02/13/2019   HCT 27.5 (L) 02/13/2019   MCV 93.2 02/13/2019   PLT 181 02/13/2019    --/--/A POS (07/28 1956)  A/P Post op day #1 s/p primary c/s for Arrest of dilation/ patient request Mild anemia- continue PNV with iron Temp of 101.2 at 19:27 7/28, WBC 12.6, afebrile since, will monitor.  Elevated severe BP x 1 approx same time last night, normal BPs since S/p ancef for c/s and ampicillin at time of cerclage removal. Patient desires circ, peds wants delay until tomorrow d/t temp instability of newborn. Routine care.  Expect d/c 7/30 .    Allyn Kenner

## 2019-02-13 NOTE — Lactation Note (Addendum)
This note was copied from a baby's chart. Lactation Consultation Note  Patient Name: Carolyn Webb [id number]'[id number] Date: 02/13/2019 Reason for consult: Initial assessment;1st time breastfeeding;Late-preterm 34-36.6wks P1, 7 hour female LPTI. LC changed one void diaper while in room. Per mom, infant was a little spitty at first but now is mostly sleeping. LC discussed this is normal behavior within first 24 hours, mom is encouraged to continue to do STS and attempt to latch infant to breast when he cues.  Mom made 3 attempts to latch infant to breast, infant is sleepy and reluctant to latch at this time. Infant suckled a  few times on LC gloved finger and stopped. Mom hand expressing and  Infant was  5 ml  EBM on spoon. Mom has DEBP in room that was set up by Nurse but mom not used it yet. LC discussed LPTI policy, mom knows to breastfeed infant according hunger cues, 8 to 12 times within 24 hours and not to  make baby wait to breastfeed. Mom will do as much STS and keep infant hat on and not exceed 30 minutes at a time while breastfeeding infant. LC dicussed that supplementing infant with EBM/ and or  formula might be needed. Mom knows that infant at 0-24 hours of life after birth  would need be supplemented with 5-10 ml every feeding. Mom knows to use DEBP every 3 hours for 15 minutes on initial setting. Mom shown how to use DEBP & how to disassemble, clean, & reassemble parts. LC discussed I & O. Mom made aware of O/P services, breastfeeding support groups, community resources, and our phone # for post-discharge questions.   Maternal Data Formula Feeding for Exclusion: No Has patient been taught Hand Expression?: Yes(infant was given 5 ml of EBM by spoon.) Does the patient have breastfeeding experience prior to this delivery?: No  Feeding Feeding Type: Breast Fed  LATCH Score Latch: Too sleepy or reluctant, no latch achieved, no sucking elicited.  Audible Swallowing: None  Type of  Nipple: Inverted  Comfort (Breast/Nipple): Soft / non-tender  Hold (Positioning): Assistance needed to correctly position infant at breast and maintain latch.  LATCH Score: 3  Interventions Interventions: Breast feeding basics reviewed;Skin to skin;Adjust position;Breast compression;Support pillows;Breast massage;Position options;Hand express;Expressed milk;DEBP;Assisted with latch  Lactation Tools Discussed/Used Tools: Pump Breast pump type: Double-Electric Breast Pump Pump Review: Setup, frequency, and cleaning;Milk Storage Initiated by:: by Nurse Date initiated:: 02/13/19   Consult Status Consult Status: Follow-up Date: 02/13/19 Follow-up type: In-patient    Vicente Serene 02/13/2019, 4:33 AM

## 2019-02-13 NOTE — Op Note (Signed)
NAME: Carolyn Webb, JANSMA MEDICAL RECORD [id number] ACCOUNT [id number] DATE OF BIRTH:09/19/1985 FACILITY: [facility] LOCATION: [place], [place]  OPERATIVE REPORT  DATE OF PROCEDURE:  02/12/2019  PREOPERATIVE DIAGNOSES:  1.  Intrauterine pregnancy at 30 weeks estimated gestational age. 2.  Failure to progress. 3.  Fetal tachycardia.  POSTOPERATIVE DIAGNOSES:   1.  Intrauterine pregnancy at 31 weeks estimated gestational age. 2.  Failure to progress. 3.  Fetal tachycardia.  PROCEDURE:  Primary low transverse cesarean section.  SURGEON:  Freda Munro, MD  ANESTHESIA:  Epidural.  ANTIBIOTICS:  Ancef 2 grams.  DRAINS:  Foley to bedside drainage.  ESTIMATED BLOOD LOSS:  900 mL.  SPECIMENS:  Placenta sent to pathology.  COMPLICATIONS:  None.  FINDINGS:  The patient had normal fallopian tubes.  Uterus appeared to be normal and the placenta appeared to be normal.  DESCRIPTION OF PROCEDURE:  The patient was taken to the operating room where her epidural anesthetic was reinjected.  Once an adequate level was reached, she was placed in the dorsal supine position with a left lateral tilt.  She was prepped and draped  in the usual fashion for this procedure.  A Pfannenstiel incision was then made.  On entering the abdominal cavity, the bladder flap was taken down with sharp dissection.  A low transverse uterine incision was made in the midline with Metzenbaum scissors  and extended bilaterally.  The infant was delivered in the vertex presentation.  On delivery of the head, the oropharynx and nostrils were bulb suctioned.  The remaining infant was then delivered.  The cord was doubly clamped and cut and the infant  handed to the waiting NICU team.  Placenta was then manually removed.  The uterus was exteriorized, wiped with a wet lap.  Uterine incision was closed in a single layer of 0 Monocryl suture in running locking fashion.  Bladder flap was not repaired.  The  parietal peritoneum and rectus muscles were reapproximated in the midline using 0 Monocryl suture in a running fashion.  Fascia was closed using 0 Monocryl suture in a running fashion.  Subcuticular tissue was made hemostatic with the Bovie.  The skin  was then closed with 4-0 Vicryl in a subcuticular fashion.  Steri-Strips were then applied.  Instrument and lap count was correct x3.  AN/NUANCE  D:02/12/2019 T:02/13/2019 JOB:[id number]

## 2019-02-14 MED ORDER — OXYCODONE HCL 5 MG PO TABS
5.0000 mg | ORAL_TABLET | ORAL | Status: DC | PRN
  Administered 2019-02-14 – 2019-02-16 (×5): 5 mg via ORAL
  Filled 2019-02-14 (×5): qty 1

## 2019-02-14 MED ORDER — IBUPROFEN 800 MG PO TABS
800.0000 mg | ORAL_TABLET | Freq: Four times a day (QID) | ORAL | Status: DC
  Administered 2019-02-14 – 2019-02-16 (×9): 800 mg via ORAL
  Filled 2019-02-14 (×9): qty 1

## 2019-02-14 MED ORDER — ACETAMINOPHEN 325 MG PO TABS
650.0000 mg | ORAL_TABLET | Freq: Four times a day (QID) | ORAL | Status: DC
  Administered 2019-02-14 – 2019-02-16 (×9): 650 mg via ORAL
  Filled 2019-02-14 (×9): qty 2

## 2019-02-14 NOTE — Lactation Note (Signed)
This note was copied from a baby's chart. Lactation Consultation Note  Patient Name: Carolyn Webb [id number] Date: 02/14/2019 Reason for consult: Follow-up assessment;Late-preterm 34-36.6wks;Infant < 6lbs Baby is 40 hours old/6% weight loss.  Mom reports that baby is latching to breast much better.  No latch scores done recently.  Mom is post pumping and obtaining 10 mls.  Baby is currently receiving formula by bottle.  Instructed to give 10-20 mls and in 8 hours 20-30 mls.  Instructed to call for latch assessment.  No questions.  Maternal Data    Feeding Feeding Type: Bottle Fed - Formula Nipple Type: Slow - flow  LATCH Score                   Interventions    Lactation Tools Discussed/Used     Consult Status Consult Status: Follow-up Date: 02/15/19 Follow-up type: In-patient    Ave Filter 02/14/2019, 1:55 PM

## 2019-02-14 NOTE — Lactation Note (Signed)
This note was copied from a baby's chart. Lactation Consultation Note  Patient Name: Carolyn Webb [id number] Date: 02/14/2019 Reason for consult: Mother's request;Late-preterm 34-36.6wks     P1 mother whose infant is now 41 hours old.  This is a LPTI at 35+2 weeks with a CGA of 35+4 weeks.  Mother requested latch assistance.  Mother was attempting to latch baby when I arrived.  Asked permission to remove baby's onesie to feed STS and mother agreeable.  Mother's breasts are soft and non tender and nipples are everted and intact.  Baby had a wide gape and latched easily to the left breast in the football position. Demonstrated a deep latch and explained the importance of maintaining a deep latch.  Demonstrated breast compressions and he immediately began sucking.  Observed him feeding for 10 minutes before mother ready to supplement with formula.  Demonstrated paced bottle feeding and baby feeding well.  Encouraged frequent burping.  Mother has been post pumping for 15 minutes and obtaining approximately 10 mls of EBM which she feeds back to baby.  Reviewed the LPTI policy with mother and advised her to  supplement 20-30 mls after breast feeding since he is 62 hours old. Mother verbalized understanding.  Suggested she call her RN for latch assistance throughout the night as needed.  RN updated.                    Consult Status Consult Status: Follow-up Date: 02/15/19 Follow-up type: In-patient    Carolyn Webb 02/14/2019, 11:02 PM

## 2019-02-14 NOTE — Lactation Note (Addendum)
This note was copied from a baby's chart. Lactation Consultation Note  Patient Name: Carolyn Webb [id number] Date: 02/14/2019   P2, 67 hour  LPTI female infant.  Mom feels breastfeeding is going well infant is latching and no longer is spitty. LC entered room dad was changing a void and stool diaper. Per mom, she finished breastfeeding infant for 30 minutes prior to [facility] entering the room at 2:15 am and infant was given 10 ml of EBM that she had pump by spoon. LC gave parents curve tip syringe , mom doesn't want to use slow flow bottle nipple at this time, and infant received 2 ml of Similac Neosure 22 kcal  With iron.  Per mom, she has used DEBP five  In the  past 24 hours. Mom plans to start pumping every 3 hours as advised by LC. LC reviewed LPTI policy with parents. Mom's continued plan: 1. Breastfeed infant according hunger cues, 8 to 12 times within 24 hours and not exceed past 30 minutes per feeding according LPTI policy. 2. After latching infant to breast mom will supplement infant with EBM that has been pumped and/ 22kcal Similac Neosure formula based on infant age/ hours at 24-48 hours after birth: 10-20 ml every feeding. 3. Mom will use DEBP every 3 hours for 15 minutes on initial setting. 4. Parents will continue to do as much STS as possible.  Maternal Data    Feeding Feeding Type: Breast Fed  LATCH Score                   Interventions    Lactation Tools Discussed/Used     Consult Status      Vicente Serene 02/14/2019, 2:54 AM

## 2019-02-14 NOTE — Progress Notes (Signed)
Wound on pt. Right upper arm from IV removal dressed with vaseline soaked telfa and wrapped in kerlex, secured with paper tape on kerlex only.   Dressed as ordered by Dr. Carlis Abbott.

## 2019-02-14 NOTE — Progress Notes (Signed)
Patient is doing well.  She is tolerating PO, ambulating, voiding.  Pain is moderate--she has not taken any pain meds since last night.  Lochia is appropriate.    Per RN, yesterday morning IV was noted to be infiltrated with surrounding edema of upper arm.  It was running LR.  It was stopped but not immediately removed.  When the IV was removed a few hours later, some blisters / skin sloughing was noted.    Vitals:   02/13/19 1300 02/13/19 1525 02/13/19 2023 02/14/19 0618  BP:  (!) 99/57 (!) 105/55 117/67  Pulse:  60 78 67  Resp:  18 17 16   Temp:  98.2 F (36.8 C) 98.3 F (36.8 C) 98.1 F (36.7 C)  TempSrc:  Oral Oral Oral  SpO2: 100% 100% 100% 100%  Weight:      Height:        NAD Abdomen:  soft, appropriate tenderness, incisions intact and without erythema or drainage ext:    Symmetric, 1+ edema bilaterally in LE.  Right upper extremity with moderate edema, soft.  No erythema. Multiple areas of gauze covered with bandaids on upper arm at my initial evaluation.  Gauze was adherent to underlying skin.  It was soaked in NS and gently removed.   Inner upper arm--multiple areas of blisters and superficial sloughing of epidermis.  Lab Results  Component Value Date   WBC 12.6 (H) 02/13/2019   HGB 9.4 (L) 02/13/2019   HCT 27.5 (L) 02/13/2019   MCV 93.2 02/13/2019   PLT 181 02/13/2019    --/--/A POS (07/28 1956)/RImmune  A/P    33 y.o. [id number] POD 2 s/p primary c/s for arrest of dilation Routine post op and postpartum care.   Post op pain.  Has not taken pain meds in >12 hrs.  Scheduled motrin / tylenol q6h.  Oxycodone prn for breakthrough.  Discussed recommend plan with patient.  She agrees Infiltration of IV in right upper arm.  No erythema or infection.  With dress liberally with vaseline Wilhemena Durie / kerlex with BID changes Late preterm infant--NP anticipates discharge Saturday or Sunday for baby   Circumcision yesterday was delayed due to temperature instability.  Discussed w  baby's NP today--she asks to delay circumcision until tomorrow. Info relayed to parents

## 2019-02-15 NOTE — Progress Notes (Signed)
Subjective: Postpartum Day 2: Cesarean Delivery Patient reports pain controlled, no nausea or vomiting, ambulating well  Objective: Vital signs in last 24 hours: Temp:  [98 F (36.7 C)-98.7 F (37.1 C)] 98 F (36.7 C) (07/31 1325) Pulse Rate:  [61-75] 61 (07/31 0602) Resp:  [15-16] 16 (07/31 1325) BP: (122-125)/(68-73) 125/73 (07/31 1325) SpO2:  [97 %-100 %] 100 % (07/31 1325)  Physical Exam:  General: alert, cooperative and appears stated age 33: appropriate Uterine Fundus: firm Incision: healing well DVT Evaluation: No evidence of DVT seen on physical exam.  Recent Labs    02/12/19 1956 02/13/19 0555  HGB 11.0* 9.4*  HCT 32.1* 27.5*    Assessment/Plan: Status post Cesarean section. Doing well postoperatively.  Continue current care. Desires neonatal circumcision, R/B/A of procedure discussed at length. Pt understands that neonatal circumcision is not considered medically necessary and is elective. The risks include, but are not limited to bleeding, infection, damage to the penis, development of scar tissue, and having to have it redone at a later date. Pt understands theses risks and wishes to proceed   Vanessa Kick 02/15/2019, 4:51 PM

## 2019-02-16 MED ORDER — OXYCODONE-ACETAMINOPHEN 5-325 MG PO TABS: 1.0000 | ORAL_TABLET | Freq: Four times a day (QID) | ORAL | 0 refills | Status: DC | PRN

## 2019-02-16 MED ORDER — DOCUSATE SODIUM 100 MG PO CAPS: 100.0000 mg | ORAL_CAPSULE | Freq: Two times a day (BID) | ORAL | 0 refills | Status: DC

## 2019-02-16 MED ORDER — IBUPROFEN 600 MG PO TABS: 600.0000 mg | ORAL_TABLET | Freq: Four times a day (QID) | ORAL | 0 refills | Status: DC | PRN

## 2019-02-16 NOTE — Procedures (Signed)
Pre-Procedure Diagnosis: Elective Circumcision of female infant per parent request Post-Procedure Diagnosis: Same Procedure: Circumsion of female infant Surgeon: Leylani Duley, MD Anesthesia: Dorsal penile block with 1cc of 1% lidocaine/Na Bicarb 0.1 mEq EBL: min Complications: none  Neonatal circumcision completed with 1.1 cm gomco clamp after dorsal penile block administered. The infant tolerated the procedure well. Gelfoam was applied after the procedure. EBL minimal.  

## 2019-02-16 NOTE — Lactation Note (Signed)
This note was copied from a baby's chart. Lactation Consultation Note  Patient Name: Carolyn Webb [id number] Date: 02/16/2019 Reason for consult: Follow-up assessment  I returned to parents' room b/c the gift shop is not open on weekends. I discussed they could use the hand pump included in the pump kit (on double mode), to use in the interim. I explained to Dad about expected piston movement.   I reviewed the separation of all pump parts when washing & then allowing them to dry while separated. Parents were noted to have already prepared the next bottle of EBM (60 mL). I discussed with parents not putting all EBM into one bottle when offering feeding in case infant does not drink it all. I also reviewed what to do if infant does not drink all of the EBM at a feeding.   Storage guidelines for EBM were reviewed, as parents had discarded some milk that sat out at room temp for 4 hrs.   Parents were thankful for all info.  Matthias Hughs [facility] 02/16/2019, 11:21 AM

## 2019-02-16 NOTE — Discharge Summary (Signed)
Obstetric Discharge Summary Reason for Admission: preterm labor Prenatal Procedures: cerclage, NST and ultrasound Intrapartum Procedures: cesarean: low cervical, transverse Postpartum Procedures: none Complications-Operative and Postpartum: none Hemoglobin  Date Value Ref Range Status  02/13/2019 9.4 (L) 12.0 - 15.0 g/dL Final   HCT  Date Value Ref Range Status  02/13/2019 27.5 (L) 36.0 - 46.0 % Final    Physical Exam:  General: alert, cooperative and appears stated age 33: appropriate Uterine Fundus: firm Incision: healing well DVT Evaluation: No evidence of DVT seen on physical exam.  Discharge Diagnoses: Incompetent cervix and Premature labor  Discharge Information: Date: 02/16/2019 Activity: pelvic rest Diet: routine Medications: Ibuprofen, Colace and Percocet Condition: improved Instructions: refer to practice specific booklet Discharge to: home Follow-up Information    Carolyn Millers, MD Follow up in 4 week(s).   Specialty: Obstetrics and Gynecology Why: For a postpartum eval Contact information: [address] [place] [address] [phone]           Newborn Data: Live born female  Birth Weight: 5 lb 2.5 oz (2340 g) APGAR: 7, 8  Newborn Delivery   Birth date/time: 02/12/2019 21:07:00 Delivery type: C-Section, Low Transverse Trial of labor: No C-section categorization: Primary      Home with mother.  Carolyn Webb 02/16/2019, 11:46 AM

## 2019-02-16 NOTE — Lactation Note (Signed)
This note was copied from a baby's chart. Lactation Consultation Note  Patient Name: Carolyn Webb Today's Date: 02/16/2019 Reason for consult: Follow-up assessment  Mom was observed pumping with size 27 flanges, which are a good fit for her. She pumped almost 70 mL. "[name]" has gained 16g & parents are very pleased with how well he is doing with bottle feeding since the SLP consult.   The pump Mom ordered through insurance will not be arriving for a few weeks, so I gave them information on the gift shop's pump rental program.   Mom was shown how to use hand pump (double-mode) that was included in pump kit. For now, Mom is very happy to pump & BO, but is interested in putting infant to breast and transitioning to breastfeeding once [name] gets closer to his EDD. Parents are interested in having an appt made with an outpatient LC; message was sent to secretaries & outpatient LC via Epic.   This consult was initially attempted at 0805, but family was sleeping at that time.  Richey, Kimberely Hamilton 02/16/2019, 9:36 AM    

## 2019-03-19 ENCOUNTER — Ambulatory Visit: Payer: Self-pay

## 2019-03-19 NOTE — Lactation Note (Signed)
This note was copied from a baby's chart. Lactation Consultation Note  Patient Name: Tajai Dalessandro NATFT'D Date: 03/19/2019     03/19/2019  Name: Reyann Lettiere MRN: [id number] Date of Birth: 02/12/2017 Gestational Age: Gestational Age: [redacted]w[redacted]d Birth Weight: 82.5 oz Weight today:    6 pounds 14.1 ounces (3122 grams) with clean newborn diaper  25 week old LPT infant presents today with mom for feeding assessment. Infant born at 35w2 day with corrected GA of 40 w 2 d.   Infant has gained 931 grams in the last 31 days with an average daily weight gain of 30 grams a day.   Mom reports infant has been going ok and has not been able to sustain a deep latch and pulls on and off the breast. She is offering him the breast a few minutes every other day. Mom reports she wants to make sure infant gets the formula fortification as he was not gaining as well without the added formula.   Infant is being supplemented with EBM fortified with 3/4 Tsp of Similac Optigrow/120 ml.   Infant bottle feeds every 2 hours and takes 50-120 ml per feeding.   Mom is pumping about 5 x a day and getting about 4-8 ounces per pumping. She has plenty of milk to feed infant. Discussed supply and demand and importance of protecting milk supply until infant is ready to transfer more efficiently. Mom is using about all the milk she is pumping. She has not been able to store any. Mom using a hands free bra for pumping.   Discussed pre pumping to soften areola, increase hind milk and decrease flow to infant. Mom voiced understanding.   Infant with asymmetrical head shape and wants to keep his head and body turned to the left. He is noted to have some tightness when trying to turn his head to the right. Mom is sore when infant latched to the left breast, enc mom to discuss with Ped at visit tomorrow. Mom reports they usually lay him on his left side for stomach draining.   Infant latched to the left breast in  the football hold. Infant pulled on and off the breast and was not able to sustain latch. Mom did great with latching and supporting infant at the breast. Infant was noted to be tongue sucking a lot while at the breast. Mom with pain to the breast throughout feeding as infant sustains shallow latch throughout feeding. Infant with cheek dimpling with feeding on the direct nipple, nipple tissue with minimal elasticity.  Tried the # 24 Lansinoh NS and infant was noted to push the nipple into the breast. We then placed the # 24 Medela NS and infant was able to sustain latch and transferred well. Infant choked some on the breast with let down. Infant transferred well. Jaw quivering post feeding. Did not discuss possible posterior lingual frenulum with mom today, would like to reassess infant again as he gets a little older and bigger.   Discussed how the NS can be beneficial to infant feeding right now with goal of weaning off when infant is ready. Discussed that his feeding behavior is typical for LPT infant and should improve over the next few weeks.   Infant to follow up with Dr. Pricilla Holm tomorrow. Infant to follow up with Lactation in 2 weeks. Mom informed of Virtual BF Support Groups.     General Information: Mother's reason for visit: Feeding Assessment. LPT infant Consult: Initial Lactation consultant: Noralee Stain RN,IBCLC  Breastfeeding experience: BF every few days for a few minutes.   Maternal medications: Pre-natal vitamin  Breastfeeding History: Frequency of breast feeding: every other day for a few minutes Duration of feeding: few minutes  Supplementation: Supplement method: bottle(Dr. Brown's Level 1 nipple) Brand: Similac(Similac Optigrow 3/4 tsp/120 ml EBM)       Breast milk volume: 50-120 ml Breast milk frequency: every 2-3 hours   Pump type: Medela pump in style Pump frequency: 4-5 x a day Pump volume: 4-8 ounces  Infant Output Assessment: Voids per 24 hours: 8+ Urine  color: Clear yellow Stools per 24 hours: 8+ Stool color: Yellow  Breast Assessment: Breast: Filling, Compressible Nipple: Erect Pain level: 8(with latch, especially on the left breast) Pain interventions: Bra, Breast pump, Lanolin  Feeding Assessment: Infant oral assessment: Variance Infant oral assessment comment: Infant with sucking blister to upper center lip. infant with posterior lingual frenulum that inserts towards lower gum ridge. infant wtih strong suckle on gloved finger. Infant not able to sustain latch on the breast without the NS (may be from being LPT, or inelastic breast tissue of the mom). infant chokes some on the bottle and the breast. Positioning: Football(left breast, 5 minutes) Latch: 2 - Grasps breast easily, tongue down, lips flanged, rhythmical sucking. Audible swallowing: 1 - A few with stimulation Type of nipple: 2 - Everted at rest and after stimulation Comfort: 1 - Filling, red/small blisters or bruises, mild/mod discomfort Hold: 1 - Assistance needed to correctly position infant at breast and maintain latch LATCH score: 7 Latch assessment: Shallow Lips flanged: Yes Suck assessment: Displays both   Pre-feed weight: 3122 grams Post feed weight: 3124 grams Amount transferred: 2 grams Amount supplemented: 0  Additional Feeding Assessment: Infant oral assessment: Variance Infant oral assessment comment: see above Positioning: Football(left breast, 15 minutes) Latch: 2 - Grasps breast easily, tongue down, lips flanged, rhythmical sucking. Audible swallowing: 2 - Spontaneous and intermittent Type of nipple: 2 - Everted at rest and after stimulation Comfort: 1 - Filling, red/small blisters or bruises, mild/mod discomfort Hold: 1 - Assistance needed to correctly position infant at breast and maintain latch LATCH score: 8 Latch assessment: Deep Lips flanged: Yes Suck assessment: Displays both Tools: Nipple shield 24 mm Pre-feed weight: 3124 grams Post  feed weight: 3160 grams Amount transferred: 36 grams Amount supplemented: would not take  Totals: Total amount transferred: 38 ml Total supplement given: would not take Total amount pumped post feed: did not pump   Plan:   1. Offer infant the breast with feeding cues at least 2-3 x a day 2. Limit breast feeding to 20 minutes if infant is sleepy at the breast and offer him a bottle for supplementation 3. Keep infant awake at the breast as needed, feed infant skin to skin 4. Massage/compress breast with feeding as needed to keep infant awake at the breast 5. Pre pump the breasts for about 5 minutes prior to latching infant to decrease flow and to soften the areola for infant.  6. Use the # 24 Nipple Shield with feeding as needed to keep infant latched. The goal is to wean off the nipple shield when infant is able to. Try each day without it to see when infant is able to feed without it.  7. Offer infant a bottle of pumped breast milk after breast feeding until it is determined infant is transferring well 8. Infant needs about 58-78 ml (2-2.5 ounces) for 8 feedings a day or 465-620 ml (16-21 Ounces) in 24  hours. Infant may take more or less depending on how often he feeds. Feed infant until he is satisfied.  9. Would recommend that you pump about 7-8 x a day for 15-20 minutes with your Medela Pump to protect and promote your milk supply.  10. Keep up the good work 62. Thank you for allowing me to assist you today 12. Please call with any questions/concerns as needed [phone] 13. Follow up with Lactation in 2 weeks  [place], IBCLC                                                      Donn Pierini 03/19/2019, 10:37 AM

## 2019-04-02 ENCOUNTER — Ambulatory Visit: Payer: Self-pay

## 2019-04-02 NOTE — Lactation Note (Signed)
This note was copied from a baby's chart. Lactation Consultation Note  Patient Name: Carolyn Webb Pressnell UJWJX'BToday's Date: 04/02/2019     04/02/2019  Name: Carolyn Webb Dobkins MRN: [id number] Date of Birth: 02/12/2017 Gestational Age: Gestational Age: 6524w2d Birth Weight: 82.5 oz Weight today:    8 pounds 4.8 ounces (3764 grams) with clean newborn diaper.    257 week old LPT infant presents today with mom for follow up feeding assessment. Infant 42 w 2 d AGA.   Infant has gained 642 grams in the last 14 days with an average daily weight gain of 46 grams a day.   Infant is latching to the breast about 2 x a day and then getting a bottle. Infant is bottle feeding otherwise. Mom has stopped the formula. Infant is taking 3-5 ounces per feeding every 2-3 hours, with an occasional 4 hour stretch during the day. Mom reports she gives up on BF quickly when infant will not latch.   Mom is pumping 6-7 x a day and getting about 4 ounces per pumping. She has enough milk to feed infant. She has stopped the formula supplement. Mom working on pumping milk to store.   Infant latched to the left breast in the football and cross cradle hold. He was not able to sustain latch without the NS. Mom placed # 24 NS independently. Infant did better in the cross cradle hold today. Infant did come on and off the breast with feeding. Infant with difficulty maintaining latch on the breast. He tired easily at the breast. Mom wanted to give up after a few minutes. Mom reports she is leaning towards pumping and bottle feeding.    Infant with thick labial frenulum that inserts halfway down the gum ridge. Upper lip tight with flanging. Infant not able to sustain latch at the breast without the NS. Breast tissue not very elastic and may be contributing to him not being able to sustain latch. Infant with posterior lingual frenulum noted. Infant with good tongue extension and lateralization. Infant with some decreased mid  tongue elevation. Infant with some choking on the bottle and on the breast at times. Mom reports infant is spitting with almost every feeding and is noted to also come out of nose. Infant pulls on and off the breast at times, infant with wet burp with feeding. Infant is pretty gassy and spits up frequently. Mom shown possibilities of restrictions and was given handout. Reviewed with mom how lip and tongue restrictions can effect transfer, weight gain and milk supply over time. Local Provider and Website information given.   Mom is a SLP in the schools [employer]. Mom to stay home until January. Infant to follow up with Dr. Pricilla Holmucker Oct 2. Infant to follow up with Lactation as needed or 1-5 days post tongue and lip releases if completed     General Information: Mother's reason for visit: Follow up feeding assessment Consult: Follow-up Lactation consultant: Noralee StainSharon Cashe Gatt RN,IBCLC Breastfeeding experience: latching 2 x a day   Maternal medications: Pre-natal vitamin(Fenugreek (started 9/14) gtts TID)  Breastfeeding History: Frequency of breast feeding: 2 x a day Duration of feeding: 5 minutes  Supplementation: Supplement method: bottle(Dr. Browns, some choking, paced feeding)         Breast milk volume: 3-5 ounces Breast milk frequency: every 2-3 hours   Pump type: Medela pump in style Pump frequency: 6-7 x a day Pump volume: 3-4 ounces  Infant Output Assessment: Voids per 24 hours: 8+ Urine color: Clear yellow Stools  per 24 hours: 6-7 Stool color: Yellow  Breast Assessment: Breast: Soft, Compressible Nipple: Erect Pain level: 6(left breast wtih feeding) Pain interventions: Bra, Breast pump  Feeding Assessment: Infant oral assessment: Variance Infant oral assessment comment: see note Positioning: Cross cradle(left breast, 10 minutes) Latch: 1 - Repeated attempts needed to sustain latch, nipple held in mouth throughout feeding, stimulation needed to elicit sucking reflex. Audible  swallowing: 2 - Spontaneous and intermittent Type of nipple: 2 - Everted at rest and after stimulation Comfort: 1 - Filling, red/small blisters or bruises, mild/mod discomfort Hold: 1 - Assistance needed to correctly position infant at breast and maintain latch LATCH score: 7 Latch assessment: Deep Lips flanged: Yes Suck assessment: Nonnutritive   Pre-feed weight: 3724 grams Post feed weight: 3740 grams Amount transferred: 16 ml Amount supplemented: 0  Additional Feeding Assessment: Infant oral assessment: Variance Infant oral assessment comment: see note Positioning: Cross cradle(10  minutes) Latch: 1 - Repeated attempts neede to sustain latch, nipple held in mouth throughout feeding, stimulation needed to elicit sucking reflex. Audible swallowing: 2 - Spontaneous and intermittent Type of nipple: 2 - Everted at rest and after stimulation Comfort: 1 - Filling, red/small blisters or bruises, mild/mod discomfort Hold: 2 - No assistance needed to correctly position infant at breast LATCH score: 8 Latch assessment: Shallow Lips flanged: Yes Suck assessment: Displays both   Pre-feed weight: 3740 grams Post feed weight: 3756 grams Amount transferred: 16 ml Amount supplemented: 0  Totals: Total amount transferred: 32 ml Total supplement given: 0 Total amount pumped post feed: did not pump   Plan:   1. Offer infant the breast with feeding cues at least 2-3 x a day, work up as infant can tolerate 2. Limit breast feeding to 20 minutes if infant is sleepy at the breast and offer him a bottle for supplementation 3. Keep infant awake at the breast as needed, feed infant skin to skin 4. Massage/compress breast with feeding as needed to keep infant awake at the breast 5. Pre pump the breasts for about 5 minutes prior to latching infant to decrease flow and to soften the areola for infant.  6. Use the # 24 Nipple Shield with feeding as needed to keep infant latched. The goal is to wean  off the nipple shield when infant is able to. Try each day without it to see when infant is able to feed without it.  7. Offer infant a bottle of pumped breast milk after breast feeding until it is determined infant is transferring well 8. Infant needs about 69-93 ml (2.5-3 ounces) for 8 feedings a day or 555-740 ml (19-25 Ounces) in 24 hours. Infant may take more or less depending on how often he feeds. Feed infant until he is satisfied.  9. Would recommend that you pump about 7-8 x a day for 15-20 minutes with your Medela Pump to protect and promote your milk supply.  10. Keep up the good work 56. Thank you for allowing me to assist you today 12. Please call with any questions/concerns as needed [phone] 13. Follow up with Lactation as needed or 1-5 days post tongue and lip releases if completed  Donn Pierini RN, [place]  Marcha Licklider 04/02/2019, 10:52 AM

## 2019-04-30 ENCOUNTER — Ambulatory Visit: Payer: Self-pay

## 2019-04-30 NOTE — Lactation Note (Signed)
This note was copied from a baby's chart. Lactation Consultation Note  Patient Name: Carolyn Webb JFHLK'T Date: 04/30/2019     04/30/2019  Name: Carolyn Webb MRN: [id number] Date of Birth: 02/12/2017 Gestational Age: Gestational Age: [redacted]w[redacted]d Birth Weight: 82.5 oz Weight today:  Weight: 10 lb 2.1 oz (61107 g)   16 month old infant presents today with mom for feeding assessment. Infant born at 86 w 2 day with AGA 46 weeks 2 days.   Infant has gained 830 grams in the last 28 days with an average daily weight gain of 30 grams a day.   Infant post tongue and lip releases on by Dr. Orland Mustard. Infant has improved with his breast feeding. Infant is using the nipple shield with some feeding.   Infant is feeding every 1-3 hours. Infant is breast feeding 2-3 x a day. Infant is mostly not using the NS and sometimes needs it. Infant is more alert at the breast, he often will take both breasts with each feeding. Pain has decreased for mom with feedings. She reports some pain at the end of the feeding when infant sleepy. Mom will stay on the breast as long as mom will let him. Mom notes that infant is gumming on the breast. Infant is staying awake better at the breast. Infant is sleeping better at night per mom. Infant will go up to 5 hours at night with no feeding.   Infant with granulation tissue to upper lip. Upper lip is tight with flanging, it is noted to be less tight than previous exam. Infant with white area under tongue as usual with healing process. Mom is still performing stretches 4-5 x a day.   Mom is pumping 7-8 x a day and getting 5-9 ounces per pumping. Infant will take 4-5 ounces per feeding in the bottle. Infant will still take about 3 ounces in a bottle after breast feeding.   Infant with increased salivation since procedure, discussed this is normal for most infants and should decrease some until teething begins.   Infant was not willing to latch to the breast  initially. He did finally latch and fed well. Infant fed without the NS. Nipple was rounded post feeding. Mom with not pain with feeding.   Mom shown how to do suck training and handout given. Infant with tongue thrusting when trying to get infant to suckle on gloved finger.   Infant to follow up with Dr. Pricilla Holm in November. Infant to follow up with Lactation in 1 month a mom's request.   Mom is returning to work this week. She feels like she will have breaks to pump. Reviewed with mom that some pts have a slight decrease in milk supply when returning to work. Enc mom to make sure she drinks and eats.    General Information: Mother's reason for visit: Follow up feeding assessment Consult: Follow-up Lactation consultant: Noralee Stain RN,IBCLC Breastfeeding experience: latching 2-3 x a day for 20+ minutes   Maternal medications: Pre-natal vitamin, Other(Fenugreek treats)  Breastfeeding History: Frequency of breast feeding: 2-3 x a day Duration of feeding: 20+  Supplementation: Supplement method: bottle(Dr. Brown's, less choking)         Breast milk volume: 4-5 ounces Breast milk frequency: 1-5 hours   Pump type: Medela pump in style Pump frequency: 7-8 x a day Pump volume: 5-9 ounces  Infant Output Assessment: Voids per 24 hours: 8+ Urine color: Clear yellow Stools per 24 hours: 5-6 Stool color: Yellow  Breast Assessment:  Breast: Soft, Compressible Nipple: Erect Pain level: 0(5-6 at end of feeding) Pain interventions: Bra, Breast pump, Lanolin  Feeding Assessment: Infant oral assessment: Variance Infant oral assessment comment: see note Positioning: Cross cradle(right breast,) Latch: 2 - Grasps breast easily, tongue down, lips flanged, rhythmical sucking. Audible swallowing: 2 - Spontaneous and intermittent Type of nipple: 2 - Everted at rest and after stimulation Comfort: 1 - Filling, red/small blisters or bruises, mild/mod discomfort Hold: 2 - No assistance needed  to correctly position infant at breast LATCH score: 9 Latch assessment: Deep Lips flanged: Yes Suck assessment: Displays both   Pre-feed weight: 4594 grams Post feed weight: 4650 grams Amount transferred: 56 ml Amount supplemented: 0  Additional Feeding Assessment:                                    Totals: Total amount transferred: 56 ml Total supplement given: 0 Total amount pumped post feed: did not pump   Plan:  1. Offer infant the breast with feeding cues at least 2-3 x a day, work up as infant can tolerate 2. Keep infant awake at the breast as needed, feed infant skin to skin 3. Massage/compress breast with feeding as needed to keep infant awake at the breast 4. Use the # 24 Nipple Shield with feeding as needed to keep infant latched. The goal is to wean off the nipple shield when infant is able to. Try each day without it to see when infant is able to feed without it.  5. Offer infant a bottle of pumped breast milk after breast feeding if he is still cueing to feed 6. Infant needs about 84-113 ml (3-4 ounces) for 8 feedings a day or 675-900 ml (23-30 Ounces) in 24 hours. Infant may take more or less depending on how often he feeds. Feed infant until he is satisfied.  7. Would recommend that you pump about 7-8 x a day for 15-20 minutes with your Medela Pump to protect and promote your milk supply.  8. Continue stretches per Dr. Verdene Lennert 9. Suck training 5-6 x a day for 1-2 minutes per feeding for 2-3 weeks or until infant suck improves 10. Keep up the good work 47. Thank you for allowing me to assist you today 12. Please call with any questions/concerns as needed [phone] 13. Follow up with Lactation as needed   Donn Pierini RN, IBCLC                                                     Debby Freiberg Augusten Lipkin 04/30/2019, 10:20 AM

## 2019-07-08 ENCOUNTER — Other Ambulatory Visit: Payer: Self-pay | Admitting: Cardiology

## 2019-07-08 DIAGNOSIS — Z20822 Contact with and (suspected) exposure to covid-19: Secondary | ICD-10-CM

## 2019-07-10 ENCOUNTER — Telehealth: Payer: Self-pay | Admitting: Infectious Diseases

## 2019-07-10 LAB — NOVEL CORONAVIRUS, NAA: SARS-CoV-2, NAA: DETECTED — AB

## 2019-07-10 NOTE — Telephone Encounter (Signed)
I called to discuss results of her COVID+ test.   Worst day of symptoms was Sunday. She is getting better every day and now just with some mild sinus symptoms. She has no chronic medical conditions that put her at risk and will not meet criteria for monoclonal antibody infusion.  She lives with 67 month old and her husband. They are all isolating. He is currently not having any symptoms. Discussed procedure of 14d monitoring for house mates and 10d for her following day1 of symptoms. Questions answered.

## 2020-08-18 ENCOUNTER — Other Ambulatory Visit: Payer: Self-pay

## 2020-08-20 ENCOUNTER — Ambulatory Visit: Payer: 59 | Admitting: Endocrinology

## 2020-10-06 ENCOUNTER — Other Ambulatory Visit: Payer: Self-pay | Admitting: Obstetrics and Gynecology

## 2020-10-06 MED ORDER — INDOMETHACIN ER 75 MG PO CPCR: 75.0000 mg | ORAL_CAPSULE | Freq: Once | ORAL | Status: AC

## 2020-10-15 ENCOUNTER — Encounter (HOSPITAL_COMMUNITY): Payer: Self-pay | Admitting: *Deleted

## 2020-10-15 ENCOUNTER — Telehealth (HOSPITAL_COMMUNITY): Payer: Self-pay | Admitting: *Deleted

## 2020-10-15 NOTE — Telephone Encounter (Signed)
Preadmission screen  

## 2020-10-16 LAB — OB RESULTS CONSOLE HIV ANTIBODY (ROUTINE TESTING): HIV: NONREACTIVE

## 2020-10-16 LAB — OB RESULTS CONSOLE RPR: RPR: NONREACTIVE

## 2020-10-16 LAB — OB RESULTS CONSOLE GC/CHLAMYDIA
Chlamydia: NEGATIVE
Gonorrhea: NEGATIVE

## 2020-10-16 LAB — OB RESULTS CONSOLE HEPATITIS B SURFACE ANTIGEN: Hepatitis B Surface Ag: NEGATIVE

## 2020-10-16 LAB — OB RESULTS CONSOLE ABO/RH: RH Type: POSITIVE

## 2020-10-16 LAB — OB RESULTS CONSOLE RUBELLA ANTIBODY, IGM: Rubella: IMMUNE

## 2020-10-16 LAB — OB RESULTS CONSOLE ANTIBODY SCREEN: Antibody Screen: NEGATIVE

## 2020-10-26 NOTE — H&P (Signed)
35 y.o. [id number] at 14 weeks with history of incompetent cervix and successful pregnancy last time after cerclage placement.  For cerclage during this pregnancy.  Past Medical History:  Diagnosis Date  . Medical history non-contributory    Past Surgical History:  Procedure Laterality Date  . CERVICAL CERCLAGE N/A 09/21/2018   Procedure: CERCLAGE CERVICAL;  Surgeon: Carrington Clamp, MD;  Location: [facility];  Service: Gynecology;  Laterality: N/A;  . CERVICAL CERCLAGE N/A 02/12/2019   Procedure: CERCLAGE CERVICAL;  Surgeon: Carrington Clamp, MD;  Location: [facility];  Service: Gynecology;  Laterality: N/A;  . CESAREAN SECTION N/A 02/12/2019   Procedure: CESAREAN SECTION;  Surgeon: Levi Aland, MD;  Location: [facility];  Service: Obstetrics;  Laterality: N/A;  . NO PAST SURGERIES      Social History   Socioeconomic History  . Marital status: Married    Spouse name: Not on file  . Number of children: Not on file  . Years of education: Not on file  . Highest education level: Not on file  Occupational History  . Not on file  Tobacco Use  . Smoking status: Never Smoker  . Smokeless tobacco: Never Used  Vaping Use  . Vaping Use: Never used  Substance and Sexual Activity  . Alcohol use: Not Currently    Alcohol/week: 1.0 standard drink    Types: 1 Cans of beer per week  . Drug use: No  . Sexual activity: Yes    Comment: INTERCOURSE AGE 21, SEXUAL PARTNERS MORE THAN 5  Other Topics Concern  . Not on file  Social History Narrative  . Not on file   Social Determinants of Health   Financial Resource Strain: Not on file  Food Insecurity: Not on file  Transportation Needs: Not on file  Physical Activity: Not on file  Stress: Not on file  Social Connections: Not on file  Intimate Partner Violence: Not on file    No current facility-administered medications on file prior to encounter.   Current Outpatient Medications on File Prior to Encounter  Medication Sig Dispense  Refill  . acetaminophen (TYLENOL) 500 MG tablet Take 500-1,000 mg by mouth every 6 (six) hours as needed (PAIN).    . Prenatal Vit-Fe Fumarate-FA (MULTIVITAMIN-PRENATAL) 27-0.8 MG TABS tablet Take 1 tablet by mouth at bedtime.      Allergies  Allergen Reactions  . Sulfa Antibiotics Nausea Only    There were no vitals filed for this visit.  Lungs: clear to ascultation Cor:  RRR Abdomen:  soft, nontender, nondistended. Ex:  no cords, erythema Pelvic:   Vulva: no masses, no atrophy, no lesions Vagina: no tenderness, no erythema, no abnormal vaginal discharge, no vesicle(s) or ulcers, no cystocele, no rectocele Cervix: grossly normal, no discharge, no cervical motion tenderness, closed Uterus: normal size (14 weeks now), normal shape, midline, no uterine prolapse, non-tender Bladder/Urethra: normal meatus, no urethral discharge, no urethral mass, bladder non distended, Urethra well supported Adnexa/Parametria: no parametrial tenderness, no parametrial mass, no adnexal tenderness, no ovarian mass   A:  At 14 weeks for cerclage placement secondary hx of incompetent cervix.  Modified McDonald planned.  Pt had normal first tri testing with NB seen.  A+.   P: P: All risks, benefits and alternatives d/w patient and she desires to proceed.  Patient has undergone ERAS protocol and will receive SCDs during the operation and indocin postop.   Pt to have extended recovery but will go home same day if eating, ambulating, voiding  and pain control is good.  Loney Laurence

## 2020-10-27 ENCOUNTER — Other Ambulatory Visit (HOSPITAL_COMMUNITY)
Admission: RE | Admit: 2020-10-27 | Discharge: 2020-10-27 | Disposition: A | Discharge status: Home / Self Care | Payer: 59 | Source: Ambulatory Visit | Attending: Obstetrics and Gynecology | Admitting: Obstetrics and Gynecology

## 2020-10-27 DIAGNOSIS — Z01812 Encounter for preprocedural laboratory examination: Secondary | ICD-10-CM | POA: Diagnosis not present

## 2020-10-27 DIAGNOSIS — Z20822 Contact with and (suspected) exposure to covid-19: Secondary | ICD-10-CM | POA: Insufficient documentation

## 2020-10-27 LAB — SARS CORONAVIRUS 2 (TAT 6-24 HRS): SARS Coronavirus 2: NEGATIVE

## 2020-10-29 ENCOUNTER — Encounter (HOSPITAL_COMMUNITY)
Admission: RE | Disposition: A | Discharge status: Home / Self Care | Payer: Self-pay | Source: Home / Self Care | Attending: Obstetrics and Gynecology

## 2020-10-29 ENCOUNTER — Encounter (HOSPITAL_COMMUNITY): Payer: Self-pay | Admitting: Obstetrics and Gynecology

## 2020-10-29 ENCOUNTER — Ambulatory Visit (HOSPITAL_COMMUNITY): Payer: 59 | Admitting: Anesthesiology

## 2020-10-29 ENCOUNTER — Ambulatory Visit (HOSPITAL_COMMUNITY)
Admission: RE | Admit: 2020-10-29 | Discharge: 2020-10-29 | Disposition: A | Discharge status: Home / Self Care | Payer: 59 | Attending: Obstetrics and Gynecology | Admitting: Obstetrics and Gynecology

## 2020-10-29 ENCOUNTER — Other Ambulatory Visit: Payer: Self-pay

## 2020-10-29 DIAGNOSIS — O3432 Maternal care for cervical incompetence, second trimester: Secondary | ICD-10-CM | POA: Insufficient documentation

## 2020-10-29 HISTORY — PX: CERVICAL CERCLAGE: SHX1329

## 2020-10-29 LAB — CBC
HCT: 40.2 % (ref 36.0–46.0)
Hemoglobin: 13.1 g/dL (ref 12.0–15.0)
MCH: 30.8 pg (ref 26.0–34.0)
MCHC: 32.6 g/dL (ref 30.0–36.0)
MCV: 94.6 fL (ref 80.0–100.0)
Platelets: 347 10*3/uL (ref 150–400)
RBC: 4.25 MIL/uL (ref 3.87–5.11)
RDW: 13.2 % (ref 11.5–15.5)
WBC: 6.2 10*3/uL (ref 4.0–10.5)
nRBC: 0 % (ref 0.0–0.2)

## 2020-10-29 LAB — TYPE AND SCREEN
ABO/RH(D): A POS
Antibody Screen: NEGATIVE

## 2020-10-29 SURGERY — CERCLAGE, CERVIX, VAGINAL APPROACH
Anesthesia: Spinal

## 2020-10-29 MED ORDER — LIDOCAINE HCL 1 % IJ SOLN
INTRAMUSCULAR | Status: AC
  Filled 2020-10-29: qty 20

## 2020-10-29 MED ORDER — ONDANSETRON HCL 4 MG/2ML IJ SOLN: 4.0000 mg | Freq: Once | INTRAMUSCULAR | Status: DC | PRN

## 2020-10-29 MED ORDER — ONDANSETRON HCL 4 MG/2ML IJ SOLN
INTRAMUSCULAR | Status: DC | PRN
  Administered 2020-10-29: 4 mg via INTRAVENOUS

## 2020-10-29 MED ORDER — SOD CITRATE-CITRIC ACID 500-334 MG/5ML PO SOLN
ORAL | Status: AC
  Filled 2020-10-29: qty 30

## 2020-10-29 MED ORDER — SOD CITRATE-CITRIC ACID 500-334 MG/5ML PO SOLN
30.0000 mL | ORAL | Status: AC
  Administered 2020-10-29: 30 mL via ORAL

## 2020-10-29 MED ORDER — BUPIVACAINE IN DEXTROSE 0.75-8.25 % IT SOLN
INTRATHECAL | Status: DC | PRN
  Administered 2020-10-29: 7.5 mg via INTRATHECAL

## 2020-10-29 MED ORDER — ACETAMINOPHEN 10 MG/ML IV SOLN
1000.0000 mg | Freq: Once | INTRAVENOUS | Status: DC | PRN
  Administered 2020-10-29: 1000 mg via INTRAVENOUS

## 2020-10-29 MED ORDER — PHENYLEPHRINE 40 MCG/ML (10ML) SYRINGE FOR IV PUSH (FOR BLOOD PRESSURE SUPPORT)
PREFILLED_SYRINGE | INTRAVENOUS | Status: AC
  Filled 2020-10-29: qty 10

## 2020-10-29 MED ORDER — LACTATED RINGERS IV SOLN: INTRAVENOUS | Status: DC

## 2020-10-29 MED ORDER — INDOMETHACIN 50 MG RE SUPP: 50.0000 mg | Freq: Two times a day (BID) | RECTAL | 0 refills | Status: DC

## 2020-10-29 MED ORDER — LIDOCAINE HCL 1 % IJ SOLN
INTRAMUSCULAR | Status: DC | PRN
  Administered 2020-10-29: 5 mL

## 2020-10-29 MED ORDER — EPHEDRINE 5 MG/ML INJ
INTRAVENOUS | Status: AC
  Filled 2020-10-29: qty 10

## 2020-10-29 MED ORDER — INDOMETHACIN 50 MG RE SUPP
50.0000 mg | Freq: Two times a day (BID) | RECTAL | Status: DC
  Administered 2020-10-29: 50 mg via RECTAL
  Filled 2020-10-29: qty 1

## 2020-10-29 MED ORDER — FENTANYL CITRATE (PF) 100 MCG/2ML IJ SOLN: 25.0000 ug | INTRAMUSCULAR | Status: DC | PRN

## 2020-10-29 MED ORDER — ACETAMINOPHEN 10 MG/ML IV SOLN
INTRAVENOUS | Status: AC
  Filled 2020-10-29: qty 100

## 2020-10-29 MED ORDER — ONDANSETRON HCL 4 MG/2ML IJ SOLN
INTRAMUSCULAR | Status: AC
  Filled 2020-10-29: qty 2

## 2020-10-29 MED ORDER — POVIDONE-IODINE 10 % EX SWAB: 2.0000 "application " | Freq: Once | CUTANEOUS | Status: DC

## 2020-10-29 SURGICAL SUPPLY — 23 items
BLADE SURG 11 STRL SS (BLADE) ×2 IMPLANT
CANISTER SUCT 3000ML PPV (MISCELLANEOUS) ×2 IMPLANT
CATH FOLEY 2WAY SLVR 30CC 16FR (CATHETERS) IMPLANT
ELECT REM PT RETURN 9FT ADLT (ELECTROSURGICAL) ×2
ELECTRODE REM PT RTRN 9FT ADLT (ELECTROSURGICAL) ×1 IMPLANT
GLOVE BIO SURGEON STRL SZ7 (GLOVE) ×2 IMPLANT
GLOVE BIOGEL PI IND STRL 7.0 (GLOVE) ×1 IMPLANT
GLOVE BIOGEL PI INDICATOR 7.0 (GLOVE) ×1
GOWN STRL REUS W/TWL LRG LVL3 (GOWN DISPOSABLE) ×4 IMPLANT
NEEDLE MAYO CATGUT SZ4 (NEEDLE) ×2 IMPLANT
NS IRRIG 1000ML POUR BTL (IV SOLUTION) ×2 IMPLANT
PACK VAGINAL MINOR WOMEN LF (CUSTOM PROCEDURE TRAY) ×2 IMPLANT
PAD OB MATERNITY 4.3X12.25 (PERSONAL CARE ITEMS) ×2 IMPLANT
PAD PREP 24X48 CUFFED NSTRL (MISCELLANEOUS) ×2 IMPLANT
PENCIL BUTTON HOLSTER BLD 10FT (ELECTRODE) ×2 IMPLANT
SUT TICRON 2 BLUE 36 GS-21 (SUTURE) ×4 IMPLANT
SUT VIC AB 3-0 CT1 27 (SUTURE) ×1
SUT VIC AB 3-0 CT1 TAPERPNT 27 (SUTURE) ×1 IMPLANT
SYR 30ML LL (SYRINGE) IMPLANT
TOWEL OR 17X24 6PK STRL BLUE (TOWEL DISPOSABLE) ×4 IMPLANT
TRAY FOLEY W/BAG SLVR 14FR (SET/KITS/TRAYS/PACK) ×2 IMPLANT
TUBING NON-CON 1/4 X 20 CONN (TUBING) ×2 IMPLANT
YANKAUER SUCT BULB TIP NO VENT (SUCTIONS) ×2 IMPLANT

## 2020-10-29 NOTE — Anesthesia Procedure Notes (Addendum)
Procedure Name: MAC Date/Time: 10/29/2020 8:06 AM Performed by: Imagene Riches, CRNA Pre-anesthesia Checklist: Patient identified, Emergency Drugs available, Suction available, Patient being monitored and Timeout performed Patient Re-evaluated:Patient Re-evaluated prior to induction

## 2020-10-29 NOTE — Op Note (Signed)
10/29/2020  10:06 AM  PATIENT:  Carolyn Webb  35 y.o. female  PRE-OPERATIVE DIAGNOSIS:  Incompetence of cervix  POST-OPERATIVE DIAGNOSIS:  incompetence of cervix   PROCEDURE:  Procedure(s): CERCLAGE CERVICAL (N/A)  SURGEON:  Surgeon(s) and Role:    * Carrington Clamp, MD - Primary    * [name], MD - Assisting  ANESTHESIA:   spinal  EBL:  5 mL   LOCAL MEDICATIONS USED:  LIDOCAINE   SPECIMEN:  No Specimen  DISPOSITION OF SPECIMEN:  N/A  COUNTS:  YES  TOURNIQUET:  * No tourniquets in log *  DICTATION: .Note written in EPIC  PLAN OF CARE: Discharge to home after PACU  PATIENT DISPOSITION:  PACU - hemodynamically stable.   Delay start of Pharmacological VTE agent (>24hrs) due to surgical blood loss or risk of bleeding: not applicable          Findings: Cervix closed and long  Technique:  After adequate spinal anesthesia was achieved the pt was examined. The pt was then prepped and draped in usual sterile fashion; a foley was placed and bladder emptied.  The cervix was grasped with a fenestrated rings and the bladder reflexion was injected with saline.  Saline was also injected in posterior reflexion in case as well.  The reflexion of the vagina onto the cervix was then tented up and carefully incised with the metzenbaums up another centimeter, staying close to the cervix and away from the bladder.  A 0 double loaded Ticron was then used to go from 12:00 to 9:00 and 9:00 to 6:00 with one end and then 12:00 to 3:00 and 3:00 to 6:00 with the other end.  This stitch came close to the posterior os.  A second stitch was placed in the same way just superior anteriorly to the previous stitch and then at least 2 cm superior on the posterior cervix.  Both stitches were tied down tight over a small os dilator and tied on the POSTERIOR CERVIX with AIR KNOTs for identification.  The bladder flap was closed with two figure of eight stitches of 3-0 vicryl  R.  The bladder was out of the field of dissection   The pt tolerated the procedure well and was returned to the recovery room in stable condition.

## 2020-10-29 NOTE — OR Nursing (Signed)
Ambulated pt in room. Vital signs stable removed foley and peripheral iv. Baby doppler and US done by Erven Colla rn fhr 142. Discharge instructions given . Pt verbalized understanding

## 2020-10-29 NOTE — Anesthesia Procedure Notes (Signed)
Spinal  Patient location during procedure: OB Start time: 10/29/2020 7:53 AM End time: 10/29/2020 7:58 AM Reason for block: surgical anesthesia Staffing Performed: anesthesiologist  Anesthesiologist: Trevor Iha, MD Preanesthetic Checklist Completed: patient identified, IV checked, risks and benefits discussed, surgical consent, monitors and equipment checked, pre-op evaluation and timeout performed Spinal Block Patient position: sitting Prep: DuraPrep and site prepped and draped Patient monitoring: heart rate, cardiac monitor, continuous pulse ox and blood pressure Approach: midline Location: L3-4 Injection technique: single-shot Needle Needle type: Pencan  Needle gauge: 24 G Needle length: 10 cm Needle insertion depth: 6 cm Assessment Sensory level: T4 Events: CSF return Additional Notes 1 Attempt (s). Pt tolerated procedure well.

## 2020-10-29 NOTE — Anesthesia Preprocedure Evaluation (Addendum)
Anesthesia Evaluation  Patient identified by MRN, date of birth, ID band Patient awake    Reviewed: Allergy & Precautions, NPO status , Patient's Chart, lab work & pertinent test results  Airway Mallampati: II  TM Distance: >3 FB Neck ROM: Full    Dental no notable dental hx. (+) Teeth Intact, Dental Advisory Given   Pulmonary neg pulmonary ROS,    Pulmonary exam normal breath sounds clear to auscultation       Cardiovascular Exercise Tolerance: Good negative cardio ROS Normal cardiovascular exam Rhythm:Regular Rate:Normal     Neuro/Psych negative neurological ROS     GI/Hepatic negative GI ROS, Neg liver ROS,   Endo/Other  negative endocrine ROS  Renal/GU negative Renal ROS     Musculoskeletal   Abdominal   Peds  Hematology Lab Results      Component                Value               Date                      WBC                      6.2                 10/29/2020                HGB                      13.1                10/29/2020                HCT                      40.2                10/29/2020                MCV                      94.6                10/29/2020                PLT                      347                 10/29/2020              Anesthesia Other Findings   Reproductive/Obstetrics (+) Pregnancy                            Anesthesia Physical Anesthesia Plan  ASA: II  Anesthesia Plan: Spinal   Post-op Pain Management:    Induction:   PONV Risk Score and Plan: Treatment may vary due to age or medical condition  Airway Management Planned: Natural Airway  Additional Equipment: None  Intra-op Plan:   Post-operative Plan:   Informed Consent: I have reviewed the patients History and Physical, chart, labs and discussed the procedure including the risks, benefits and alternatives for the proposed anesthesia with the patient or authorized representative  who has indicated his/her understanding and acceptance.  Dental advisory given  Plan Discussed with:   Anesthesia Plan Comments: (Sp for cerclage)        Anesthesia Quick Evaluation

## 2020-10-29 NOTE — Discharge Instructions (Signed)
Cervical Cerclage, Care After This sheet gives you information about how to care for yourself after your procedure. Your health care provider may also give you more specific instructions. If you have problems or questions, contact your health care provider. What can I expect after the procedure? After your procedure, it is common to have:  Cramping in your abdomen.  Mucus discharge from your vagina. This may last for several days.  Painful urination (dysuria).  Spotting, or small drops of blood coming from your vagina. Follow these instructions at home: Medicines  Take over-the-counter and prescription medicines only as told by your health care provider.  Ask your health care provider if the medicine prescribed to you requires you to avoid driving or using heavy machinery. General instructions  If you are told to go on bed rest, follow instructions from your health care provider. You may need to be on bed rest for up to 3 days.  Keep track of your vaginal discharge and watch for any changes. If you notice changes, tell your health care provider.  Avoid physical activities and exercise until your health care provider approves. Ask your health care provider what activities are safe for you.  Do not douche or have sex until your health care provider says it is okay to do so.  Keep all follow-up visits, including prenatal visits, as told by your health care provider. This is important. ? Prenatal visits are all the care that you receive before the birth of your baby. ? You may also need an ultrasound.  You may be asked to have weekly visits to have your cervix checked.   Contact a health care provider if you:  Have abnormal discharge from your vagina, such as clots.  Have a bad-smelling discharge from your vagina.  Develop a rash on your skin. This may look like redness and swelling.  Become light-headed or feel like you are going to faint.  Have abdominal pain that does not  get better with medicine.  Have nausea or vomiting that does not go away. Get help right away if you:  Have vaginal bleeding that is heavier or more frequent than spotting.  Are leaking fluid or your water breaks.  Have a fever or chills.  Faint.  Have uterine contractions. These may feel like: ? A back ache. ? Lower abdominal pain. ? Mild cramps, similar to menstrual cramps. ? Tightening or pressure in your abdomen.  Think that your baby is not moving as much as usual, or you cannot feel your baby move.  Have chest pain.  Have shortness of breath. Summary  After the procedure, it is common to have cramping, vaginal discharge, painful urination, and small drops of blood coming from your vagina.  If you are told to go on bed rest, follow instructions from your health care provider. You may need to be on bed rest for up to 3 days.  Keep track of your vaginal discharge and watch for any changes. If you notice changes, tell your health care provider.  Contact a health care provider if you have abnormal vaginal discharge, become light-headed, or have pain that cannot be controlled with medicines.  Get help right away if you have heavy vaginal bleeding, your water breaks, or you have uterine contractions. Also, get help right away if your baby is not moving as much as usual, or you have chest pain or shortness of breath. This information is not intended to replace advice given to you by your health care   provider. Make sure you discuss any questions you have with your health care provider. Document Revised: 08/23/2019 Document Reviewed: 02/27/2019 Elsevier Patient Education  2021 [employer] Inc[employer]  

## 2020-10-29 NOTE — Brief Op Note (Signed)
10/29/2020  10:06 AM  PATIENT:  Carolyn Webb  35 y.o. female  PRE-OPERATIVE DIAGNOSIS:  Incompetence of cervix  POST-OPERATIVE DIAGNOSIS:  incompetence of cervix   PROCEDURE:  Procedure(s): CERCLAGE CERVICAL (N/A)  SURGEON:  Surgeon(s) and Role:    * Carrington Clamp, MD - Primary    * [name], MD - Assisting  ANESTHESIA:   spinal  EBL:  5 mL   LOCAL MEDICATIONS USED:  LIDOCAINE   SPECIMEN:  No Specimen  DISPOSITION OF SPECIMEN:  N/A  COUNTS:  YES  TOURNIQUET:  * No tourniquets in log *  DICTATION: .Note written in EPIC  PLAN OF CARE: Discharge to home after PACU  PATIENT DISPOSITION:  PACU - hemodynamically stable.   Delay start of Pharmacological VTE agent (>24hrs) due to surgical blood loss or risk of bleeding: not applicable

## 2020-10-29 NOTE — Anesthesia Postprocedure Evaluation (Signed)
Anesthesia Post Note  Patient: [name]  Procedure(s) Performed: CERCLAGE CERVICAL (N/A )     Patient location during evaluation: PACU Anesthesia Type: Spinal Level of consciousness: oriented and awake and alert Pain management: pain level controlled Vital Signs Assessment: post-procedure vital signs reviewed and stable Respiratory status: spontaneous breathing and respiratory function stable Cardiovascular status: blood pressure returned to baseline and stable Postop Assessment: no headache, no backache, no apparent nausea or vomiting and able to ambulate Anesthetic complications: no   No complications documented.  Last Vitals:  Vitals:   10/29/20 1030 10/29/20 1045  BP: 106/66 109/71  Pulse: (!) 54 (!) 57  Resp: 13 18  Temp:  37 C  SpO2: 98%     Last Pain:  Vitals:   10/29/20 1045  TempSrc:   PainSc: 2    Pain Goal:    LLE Motor Response: Purposeful movement (10/29/20 1030)   RLE Motor Response: Purposeful movement (10/29/20 1030)       Epidural/Spinal Function Patient able to flex knees: Yes (10/29/20 1030), Patient able to lift hips off bed: Yes (10/29/20 1030), Back pain beyond tenderness at insertion site: No (10/29/20 1030), Progressively worsening motor and/or sensory loss: No (10/29/20 1030), Bowel and/or bladder incontinence post epidural: No (10/29/20 1030)  Carolyn Webb

## 2020-10-29 NOTE — Progress Notes (Signed)
There has been no change in the patients history, status or exam since the history and physical.  Vitals:   10/29/20 0540 10/29/20 0554  BP:  122/82  Pulse:  86  Resp:  18  Temp:  98 F (36.7 C)  TempSrc:  Oral  SpO2:  100%  Weight: 69.9 kg   Height: 5\' 2"  (1.575 m) 5\' 2"  (1.575 m)    Results for orders placed or performed during the hospital encounter of 10/27/20 (from the past 72 hour(s))  SARS CORONAVIRUS 2 (TAT 6-24 HRS) Nasopharyngeal Nasopharyngeal Swab     Status: None   Collection Time: 10/27/20  9:39 AM   Specimen: Nasopharyngeal Swab  Result Value Ref Range   SARS Coronavirus 2 NEGATIVE NEGATIVE    Comment: (NOTE) SARS-CoV-2 target nucleic acids are NOT DETECTED.  The SARS-CoV-2 RNA is generally detectable in upper and lower respiratory specimens during the acute phase of infection. Negative results do not preclude SARS-CoV-2 infection, do not rule out co-infections with other pathogens, and should not be used as the sole basis for treatment or other patient management decisions. Negative results must be combined with clinical observations, patient history, and epidemiological information. The expected result is Negative.  Fact Sheet for Patients: 12/27/20  Fact Sheet for Healthcare Providers: 12/27/20  This test is not yet approved or cleared by the [employer] FDA and  has been authorized for detection and/or diagnosis of SARS-CoV-2 by FDA under an Emergency Use Authorization (EUA). This EUA will remain  in effect (meaning this test can be used) for the duration of the COVID-19 declaration under Se ction 564(b)(1) of the Act, 21 U.S.C. section 360bbb-3(b)(1), unless the authorization is terminated or revoked sooner.  Performed at [facility], [address], [place], [address] [place]     [place]

## 2020-10-29 NOTE — Transfer of Care (Signed)
Immediate Anesthesia Transfer of Care Note  Patient: Carolyn Webb  Procedure(s) Performed: CERCLAGE CERVICAL (N/A )  Patient Location: PACU  Anesthesia Type:Spinal  Level of Consciousness: awake, alert  and oriented  Airway & Oxygen Therapy: Patient Spontanous Breathing  Post-op Assessment: Report given to RN and Post -op Vital signs reviewed and stable  Post vital signs: Reviewed and stable  Last Vitals:  Vitals Value Taken Time  BP 109/73 10/29/20 0849  Temp    Pulse 54 10/29/20 0851  Resp 17 10/29/20 0851  SpO2 96 % 10/29/20 0851  Vitals shown include unvalidated device data.  Last Pain:  Vitals:   10/29/20 0554  TempSrc: Oral  PainSc: 0-No pain         Complications: No complications documented.

## 2021-01-28 ENCOUNTER — Ambulatory Visit: Payer: Self-pay

## 2021-03-26 ENCOUNTER — Inpatient Hospital Stay (HOSPITAL_COMMUNITY)
Admission: AD | Admit: 2021-03-26 | Discharge: 2021-03-26 | Disposition: A | Discharge status: Home / Self Care | Payer: 59 | Attending: Obstetrics and Gynecology | Admitting: Obstetrics and Gynecology

## 2021-03-26 ENCOUNTER — Encounter (HOSPITAL_COMMUNITY): Payer: Self-pay | Admitting: Obstetrics and Gynecology

## 2021-03-26 DIAGNOSIS — Z882 Allergy status to sulfonamides status: Secondary | ICD-10-CM | POA: Diagnosis not present

## 2021-03-26 DIAGNOSIS — Z3689 Encounter for other specified antenatal screening: Secondary | ICD-10-CM

## 2021-03-26 DIAGNOSIS — Z3A35 35 weeks gestation of pregnancy: Secondary | ICD-10-CM

## 2021-03-26 DIAGNOSIS — O4703 False labor before 37 completed weeks of gestation, third trimester: Secondary | ICD-10-CM

## 2021-03-26 LAB — URINALYSIS, ROUTINE W REFLEX MICROSCOPIC
Bilirubin Urine: NEGATIVE
Glucose, UA: NEGATIVE mg/dL
Hgb urine dipstick: NEGATIVE
Ketones, ur: NEGATIVE mg/dL
Leukocytes,Ua: NEGATIVE
Nitrite: NEGATIVE
Protein, ur: NEGATIVE mg/dL
Specific Gravity, Urine: 1.01 (ref 1.005–1.030)
pH: 7 (ref 5.0–8.0)

## 2021-03-26 MED ORDER — LACTATED RINGERS IV BOLUS
1000.0000 mL | Freq: Once | INTRAVENOUS | Status: AC
  Administered 2021-03-26: 1000 mL via INTRAVENOUS

## 2021-03-26 MED ORDER — NIFEDIPINE 10 MG PO CAPS
10.0000 mg | ORAL_CAPSULE | ORAL | Status: AC
  Administered 2021-03-26 (×2): 10 mg via ORAL
  Filled 2021-03-26 (×2): qty 1

## 2021-03-26 NOTE — MAU Provider Note (Signed)
History     CSN: [id number]  Arrival date and time: 03/26/21 1449   Event Date/Time   First Provider Initiated Contact with Patient 03/26/21 1538      Chief Complaint  Patient presents with   Contractions   35 y.o. [id number] @35 .4 wks presenting with ctx. Reports onset around noon today. Frequency was q5-6 weeks initially but feels they are less frequent now. Denies VB or LOF. Reports good FM. She feels well hydrated and eating well. She is planning a repeat CS and has cerclage in place.   OB History     Gravida  4   Para  2   Term  0   Preterm  2   AB  1   Living  1      SAB  1   IAB  0   Ectopic  0   Multiple  0   Live Births  2           Past Medical History:  Diagnosis Date   Medical history non-contributory     Past Surgical History:  Procedure Laterality Date   CERVICAL CERCLAGE N/A 09/21/2018   Procedure: CERCLAGE CERVICAL;  Surgeon: 11/21/2018, MD;  Location: [facility];  Service: Gynecology;  Laterality: N/A;   CERVICAL CERCLAGE N/A 02/12/2019   Procedure: CERCLAGE CERVICAL;  Surgeon: 02/14/2019, MD;  Location: [facility];  Service: Gynecology;  Laterality: N/A;   CERVICAL CERCLAGE N/A 10/29/2020   Procedure: CERCLAGE CERVICAL;  Surgeon: 10/31/2020, MD;  Location: [facility];  Service: Gynecology;  Laterality: N/A;   CESAREAN SECTION N/A 02/12/2019   Procedure: CESAREAN SECTION;  Surgeon: 02/14/2019, MD;  Location: [facility];  Service: Obstetrics;  Laterality: N/A;   NO PAST SURGERIES      Family History  Problem Relation Age of Onset   Hypertension Maternal Grandmother    High Cholesterol Maternal Grandmother    Diabetes Maternal Grandfather    Heart disease Paternal Grandmother    Parkinson's disease Paternal Grandfather    High Cholesterol Other    Muscular dystrophy Maternal Uncle    Cleft lip Paternal Aunt    Muscular dystrophy Maternal Uncle    Muscular dystrophy Cousin     Social History   Tobacco Use    Smoking status: Never   Smokeless tobacco: Never  Vaping Use   Vaping Use: Never used  Substance Use Topics   Alcohol use: Not Currently    Alcohol/week: 1.0 standard drink    Types: 1 Cans of beer per week   Drug use: No    Allergies:  Allergies  Allergen Reactions   Sulfa Antibiotics Nausea Only    No medications prior to admission.    Review of Systems  Gastrointestinal:  Positive for abdominal pain (ctx).  Genitourinary:  Negative for vaginal bleeding and vaginal discharge.  Physical Exam   Blood pressure 116/66, pulse 98, temperature 97.9 F (36.6 C), temperature source Oral, resp. rate 16, height 5\' 2"  (1.575 m), weight 77.3 kg, SpO2 100 %, unknown if currently breastfeeding.  Physical Exam Vitals and nursing note reviewed. Exam conducted with a chaperone present.  Constitutional:      Appearance: Normal appearance.  HENT:     Head: Normocephalic and atraumatic.     Nose: Nose normal.  Cardiovascular:     Rate and Rhythm: Normal rate.  Pulmonary:     Effort: Pulmonary effort is normal. No respiratory distress.  Abdominal:  Palpations: Abdomen is soft.     Tenderness: There is no abdominal tenderness.  Genitourinary:    Comments: Speculum: visually closed, cerclage not visualized VE: closed/thick, cerclage not palpated Musculoskeletal:        General: Normal range of motion.     Cervical back: Normal range of motion.  Skin:    General: Skin is warm and dry.  Neurological:     General: No focal deficit present.     Mental Status: She is alert and oriented to person, place, and time.  Psychiatric:        Mood and Affect: Mood normal.        Behavior: Behavior normal.  EFM: 135 bpm, mod variability, + accels, no decels Toco: irregular w/UI  Results for orders placed or performed during the hospital encounter of 03/26/21 (from the past 24 hour(s))  Urinalysis, Routine w reflex microscopic Urine, Clean Catch     Status: Abnormal   Collection Time:  03/26/21  3:03 PM  Result Value Ref Range   Color, Urine YELLOW YELLOW   APPearance CLEAR CLEAR   Specific Gravity, Urine 1.010 1.005 - 1.030   pH 7.0 5.0 - 8.0   Glucose, UA NEGATIVE NEGATIVE mg/dL   Hgb urine dipstick NEGATIVE NEGATIVE   Bilirubin Urine NEGATIVE NEGATIVE   Ketones, ur NEGATIVE NEGATIVE mg/dL   Protein, ur NEGATIVE NEGATIVE mg/dL   Nitrite NEGATIVE NEGATIVE   Leukocytes,Ua NEGATIVE NEGATIVE   RBC / HPF 0-5 0 - 5 RBC/hpf   WBC, UA 0-5 0 - 5 WBC/hpf   Bacteria, UA MANY (A) NONE SEEN   Squamous Epithelial / LPF 0-5 0 - 5   Mucus PRESENT    MAU Course  Procedures LR 1L Procardia x2 doses  MDM Reviewed prenatal record: pregnancy complicated by previous CS, previous delivery at 21 weeks w/cervical incompetence and cerclage placed this pregnancy. Labs ordered and reviewed. No signs of UTI.  1730: Reports not feeling ctx. Cervix rechecked and unchanged. No evidence of PTL. Stable for discharge home.  Assessment and Plan   1. [redacted] weeks gestation of pregnancy   2. NST (non-stress test) reactive   3. Preterm uterine contractions, antepartum, third trimester    Discharge home Follow up at [facility] next week PTL precautions  Allergies as of 03/26/2021       Reactions   Sulfa Antibiotics Nausea Only        Medication List     TAKE these medications    acetaminophen 500 MG tablet Commonly known as: TYLENOL Take 500-1,000 mg by mouth every 6 (six) hours as needed (PAIN).   indomethacin 50 MG suppository Commonly known as: INDOCIN Place 1 suppository (50 mg total) rectally 2 (two) times daily.   multivitamin-prenatal 27-0.8 MG Tabs tablet Take 1 tablet by mouth at bedtime.        Donette Larry, CNM 03/26/2021, 6:00 PM

## 2021-03-26 NOTE — MAU Note (Signed)
Started contracting around 1200, feeling some pressure. No bleeding or leaking.

## 2021-03-30 ENCOUNTER — Other Ambulatory Visit: Payer: Self-pay | Admitting: Obstetrics and Gynecology

## 2021-04-06 NOTE — Patient Instructions (Signed)
Jamille Fisher Denny  04/06/2021   Your procedure is scheduled on:  04/22/2021  Arrive at 1000 at Entrance C on [address] [facility]  and [employer]. You are invited to use the FREE valet parking or use the Visitor's parking deck.  Pick up the phone at the desk and dial [phone].  Call this number if you have problems the morning of surgery: [phone]  Remember:   Do not eat food:(After Midnight) Desps de medianoche.  Do not drink clear liquids: (After Midnight) Desps de medianoche.  Take these medicines the morning of surgery with A SIP OF WATER:  none   Do not wear jewelry, make-up or nail polish.  Do not wear lotions, powders, or perfumes. Do not wear deodorant.  Do not shave 48 hours prior to surgery.  Do not bring valuables to the hospital.  [facility] is not   responsible for any belongings or valuables brought to the hospital.  Contacts, dentures or bridgework may not be worn into surgery.  Leave suitcase in the car. After surgery it may be brought to your room.  For patients admitted to the hospital, checkout time is 11:00 AM the day of              discharge.      Please read over the following fact sheets that you were given:     Preparing for Surgery

## 2021-04-09 ENCOUNTER — Encounter (HOSPITAL_COMMUNITY): Payer: Self-pay

## 2021-04-12 ENCOUNTER — Encounter (HOSPITAL_COMMUNITY): Payer: Self-pay | Admitting: Anesthesiology

## 2021-04-12 ENCOUNTER — Inpatient Hospital Stay (HOSPITAL_COMMUNITY)
Admission: AD | Admit: 2021-04-12 | Discharge: 2021-04-15 | DRG: 783 | Disposition: A | Discharge status: Home / Self Care | Payer: 59 | Attending: Obstetrics and Gynecology | Admitting: Obstetrics and Gynecology

## 2021-04-12 ENCOUNTER — Inpatient Hospital Stay (HOSPITAL_COMMUNITY): Payer: 59 | Admitting: Anesthesiology

## 2021-04-12 ENCOUNTER — Other Ambulatory Visit: Payer: Self-pay

## 2021-04-12 ENCOUNTER — Encounter (HOSPITAL_COMMUNITY): Payer: Self-pay | Admitting: Obstetrics and Gynecology

## 2021-04-12 ENCOUNTER — Encounter (HOSPITAL_COMMUNITY)
Admission: AD | Disposition: A | Discharge status: Home / Self Care | Payer: Self-pay | Source: Home / Self Care | Attending: Obstetrics and Gynecology

## 2021-04-12 DIAGNOSIS — Z98891 History of uterine scar from previous surgery: Secondary | ICD-10-CM

## 2021-04-12 DIAGNOSIS — Z20822 Contact with and (suspected) exposure to covid-19: Secondary | ICD-10-CM | POA: Diagnosis present

## 2021-04-12 DIAGNOSIS — O26893 Other specified pregnancy related conditions, third trimester: Secondary | ICD-10-CM | POA: Diagnosis present

## 2021-04-12 DIAGNOSIS — Z302 Encounter for sterilization: Secondary | ICD-10-CM

## 2021-04-12 DIAGNOSIS — O3433 Maternal care for cervical incompetence, third trimester: Secondary | ICD-10-CM | POA: Diagnosis present

## 2021-04-12 DIAGNOSIS — O34211 Maternal care for low transverse scar from previous cesarean delivery: Secondary | ICD-10-CM | POA: Diagnosis present

## 2021-04-12 DIAGNOSIS — Z3A38 38 weeks gestation of pregnancy: Secondary | ICD-10-CM

## 2021-04-12 LAB — CBC
HCT: 38.5 % (ref 36.0–46.0)
Hemoglobin: 12.7 g/dL (ref 12.0–15.0)
MCH: 32.2 pg (ref 26.0–34.0)
MCHC: 33 g/dL (ref 30.0–36.0)
MCV: 97.7 fL (ref 80.0–100.0)
Platelets: 342 10*3/uL (ref 150–400)
RBC: 3.94 MIL/uL (ref 3.87–5.11)
RDW: 15.6 % — ABNORMAL HIGH (ref 11.5–15.5)
WBC: 7.2 10*3/uL (ref 4.0–10.5)
nRBC: 0 % (ref 0.0–0.2)

## 2021-04-12 LAB — RESP PANEL BY RT-PCR (FLU A&B, COVID) ARPGX2
Influenza A by PCR: NEGATIVE
Influenza B by PCR: NEGATIVE
SARS Coronavirus 2 by RT PCR: NEGATIVE

## 2021-04-12 LAB — TYPE AND SCREEN
ABO/RH(D): A POS
Antibody Screen: NEGATIVE

## 2021-04-12 LAB — POCT FERN TEST: POCT Fern Test: POSITIVE

## 2021-04-12 SURGERY — Surgical Case
Anesthesia: Spinal | Site: Abdomen | Laterality: Right | Wound class: Clean Contaminated

## 2021-04-12 MED ORDER — PHENYLEPHRINE HCL-NACL 20-0.9 MG/250ML-% IV SOLN
INTRAVENOUS | Status: DC | PRN
  Administered 2021-04-12: 60 ug/min via INTRAVENOUS

## 2021-04-12 MED ORDER — NALBUPHINE HCL 10 MG/ML IJ SOLN: 5.0000 mg | Freq: Once | INTRAMUSCULAR | Status: DC | PRN

## 2021-04-12 MED ORDER — KETOROLAC TROMETHAMINE 30 MG/ML IJ SOLN
30.0000 mg | Freq: Once | INTRAMUSCULAR | Status: AC | PRN
  Administered 2021-04-12: 30 mg via INTRAVENOUS

## 2021-04-12 MED ORDER — IBUPROFEN 600 MG PO TABS
600.0000 mg | ORAL_TABLET | Freq: Four times a day (QID) | ORAL | Status: DC
  Administered 2021-04-13 – 2021-04-15 (×7): 600 mg via ORAL
  Filled 2021-04-12 (×7): qty 1

## 2021-04-12 MED ORDER — LACTATED RINGERS IV BOLUS
1000.0000 mL | Freq: Once | INTRAVENOUS | Status: AC
  Administered 2021-04-12: 1000 mL via INTRAVENOUS

## 2021-04-12 MED ORDER — NALBUPHINE HCL 10 MG/ML IJ SOLN: 5.0000 mg | INTRAMUSCULAR | Status: DC | PRN

## 2021-04-12 MED ORDER — SENNOSIDES-DOCUSATE SODIUM 8.6-50 MG PO TABS
2.0000 | ORAL_TABLET | ORAL | Status: DC
  Administered 2021-04-13 – 2021-04-14 (×2): 2 via ORAL
  Filled 2021-04-12 (×2): qty 2

## 2021-04-12 MED ORDER — OXYTOCIN-SODIUM CHLORIDE 30-0.9 UT/500ML-% IV SOLN
INTRAVENOUS | Status: DC | PRN
  Administered 2021-04-12: 500 mL via INTRAVENOUS
  Administered 2021-04-12: 50 mL via INTRAVENOUS

## 2021-04-12 MED ORDER — MAGNESIUM HYDROXIDE 400 MG/5ML PO SUSP: 30.0000 mL | ORAL | Status: DC | PRN

## 2021-04-12 MED ORDER — ACETAMINOPHEN 10 MG/ML IV SOLN
INTRAVENOUS | Status: DC | PRN
  Administered 2021-04-12: 1000 mg via INTRAVENOUS

## 2021-04-12 MED ORDER — KETOROLAC TROMETHAMINE 30 MG/ML IJ SOLN
INTRAMUSCULAR | Status: AC
  Filled 2021-04-12: qty 1

## 2021-04-12 MED ORDER — OXYTOCIN-SODIUM CHLORIDE 30-0.9 UT/500ML-% IV SOLN: 2.5000 [IU]/h | INTRAVENOUS | Status: AC

## 2021-04-12 MED ORDER — DIPHENHYDRAMINE HCL 50 MG/ML IJ SOLN: 12.5000 mg | INTRAMUSCULAR | Status: DC | PRN

## 2021-04-12 MED ORDER — MEPERIDINE HCL 25 MG/ML IJ SOLN: 6.2500 mg | INTRAMUSCULAR | Status: DC | PRN

## 2021-04-12 MED ORDER — ONDANSETRON HCL 4 MG/2ML IJ SOLN: 4.0000 mg | Freq: Three times a day (TID) | INTRAMUSCULAR | Status: DC | PRN

## 2021-04-12 MED ORDER — CEFAZOLIN SODIUM-DEXTROSE 2-4 GM/100ML-% IV SOLN
2.0000 g | INTRAVENOUS | Status: AC
  Administered 2021-04-12: 2 g via INTRAVENOUS
  Filled 2021-04-12: qty 100

## 2021-04-12 MED ORDER — MEASLES, MUMPS & RUBELLA VAC IJ SOLR: 0.5000 mL | Freq: Once | INTRAMUSCULAR | Status: DC

## 2021-04-12 MED ORDER — OXYCODONE HCL 5 MG/5ML PO SOLN: 5.0000 mg | Freq: Once | ORAL | Status: DC | PRN

## 2021-04-12 MED ORDER — SODIUM CHLORIDE 0.9% FLUSH: 3.0000 mL | INTRAVENOUS | Status: DC | PRN

## 2021-04-12 MED ORDER — DIPHENHYDRAMINE HCL 25 MG PO CAPS: 25.0000 mg | ORAL_CAPSULE | ORAL | Status: DC | PRN

## 2021-04-12 MED ORDER — NALOXONE HCL 4 MG/10ML IJ SOLN
1.0000 ug/kg/h | INTRAVENOUS | Status: DC | PRN
  Filled 2021-04-12: qty 5

## 2021-04-12 MED ORDER — PRENATAL MULTIVITAMIN CH
1.0000 | ORAL_TABLET | Freq: Every day | ORAL | Status: DC
  Administered 2021-04-13 – 2021-04-15 (×3): 1 via ORAL
  Filled 2021-04-12 (×3): qty 1

## 2021-04-12 MED ORDER — DIBUCAINE (PERIANAL) 1 % EX OINT: 1.0000 "application " | TOPICAL_OINTMENT | CUTANEOUS | Status: DC | PRN

## 2021-04-12 MED ORDER — DEXAMETHASONE SODIUM PHOSPHATE 4 MG/ML IJ SOLN
INTRAMUSCULAR | Status: AC
  Filled 2021-04-12: qty 1

## 2021-04-12 MED ORDER — ACETAMINOPHEN 500 MG PO TABS
1000.0000 mg | ORAL_TABLET | Freq: Four times a day (QID) | ORAL | Status: DC
  Administered 2021-04-12 – 2021-04-15 (×10): 1000 mg via ORAL
  Filled 2021-04-12 (×11): qty 2

## 2021-04-12 MED ORDER — COCONUT OIL OIL: 1.0000 "application " | TOPICAL_OIL | Status: DC | PRN

## 2021-04-12 MED ORDER — HYDROMORPHONE HCL 1 MG/ML IJ SOLN: 0.2500 mg | INTRAMUSCULAR | Status: DC | PRN

## 2021-04-12 MED ORDER — OXYCODONE HCL 5 MG PO TABS: 5.0000 mg | ORAL_TABLET | Freq: Once | ORAL | Status: DC | PRN

## 2021-04-12 MED ORDER — DIPHENHYDRAMINE HCL 25 MG PO CAPS: 25.0000 mg | ORAL_CAPSULE | Freq: Four times a day (QID) | ORAL | Status: DC | PRN

## 2021-04-12 MED ORDER — TETANUS-DIPHTH-ACELL PERTUSSIS 5-2.5-18.5 LF-MCG/0.5 IM SUSY: 0.5000 mL | PREFILLED_SYRINGE | Freq: Once | INTRAMUSCULAR | Status: DC

## 2021-04-12 MED ORDER — BUPIVACAINE IN DEXTROSE 0.75-8.25 % IT SOLN
INTRATHECAL | Status: DC | PRN
  Administered 2021-04-12: 1.6 mL via INTRATHECAL

## 2021-04-12 MED ORDER — PROMETHAZINE HCL 25 MG/ML IJ SOLN
6.2500 mg | INTRAMUSCULAR | Status: DC | PRN
  Administered 2021-04-12: 12.5 mg via INTRAVENOUS

## 2021-04-12 MED ORDER — SCOPOLAMINE 1 MG/3DAYS TD PT72
1.0000 | MEDICATED_PATCH | Freq: Once | TRANSDERMAL | Status: AC
  Administered 2021-04-12: 1.5 mg via TRANSDERMAL

## 2021-04-12 MED ORDER — MORPHINE SULFATE (PF) 0.5 MG/ML IJ SOLN
INTRAMUSCULAR | Status: AC
  Filled 2021-04-12: qty 10

## 2021-04-12 MED ORDER — ACETAMINOPHEN 500 MG PO TABS: 1000.0000 mg | ORAL_TABLET | Freq: Four times a day (QID) | ORAL | Status: DC

## 2021-04-12 MED ORDER — OXYTOCIN-SODIUM CHLORIDE 30-0.9 UT/500ML-% IV SOLN
INTRAVENOUS | Status: AC
  Filled 2021-04-12: qty 500

## 2021-04-12 MED ORDER — SCOPOLAMINE 1 MG/3DAYS TD PT72
MEDICATED_PATCH | TRANSDERMAL | Status: AC
  Filled 2021-04-12: qty 1

## 2021-04-12 MED ORDER — METHYLERGONOVINE MALEATE 0.2 MG/ML IJ SOLN: 0.2000 mg | INTRAMUSCULAR | Status: DC | PRN

## 2021-04-12 MED ORDER — PROMETHAZINE HCL 25 MG/ML IJ SOLN
INTRAMUSCULAR | Status: AC
  Filled 2021-04-12: qty 1

## 2021-04-12 MED ORDER — ZOLPIDEM TARTRATE 5 MG PO TABS: 5.0000 mg | ORAL_TABLET | Freq: Every evening | ORAL | Status: DC | PRN

## 2021-04-12 MED ORDER — NALOXONE HCL 0.4 MG/ML IJ SOLN: 0.4000 mg | INTRAMUSCULAR | Status: DC | PRN

## 2021-04-12 MED ORDER — FENTANYL CITRATE (PF) 100 MCG/2ML IJ SOLN
INTRAMUSCULAR | Status: DC | PRN
  Administered 2021-04-12: 15 ug via INTRATHECAL

## 2021-04-12 MED ORDER — DEXAMETHASONE SODIUM PHOSPHATE 4 MG/ML IJ SOLN
INTRAMUSCULAR | Status: DC | PRN
  Administered 2021-04-12: 4 mg via INTRAVENOUS

## 2021-04-12 MED ORDER — ONDANSETRON HCL 4 MG/2ML IJ SOLN
INTRAMUSCULAR | Status: DC | PRN
  Administered 2021-04-12: 4 mg via INTRAVENOUS

## 2021-04-12 MED ORDER — FENTANYL CITRATE (PF) 100 MCG/2ML IJ SOLN
INTRAMUSCULAR | Status: AC
  Filled 2021-04-12: qty 2

## 2021-04-12 MED ORDER — MENTHOL 3 MG MT LOZG: 1.0000 | LOZENGE | OROMUCOSAL | Status: DC | PRN

## 2021-04-12 MED ORDER — WITCH HAZEL-GLYCERIN EX PADS: 1.0000 "application " | MEDICATED_PAD | CUTANEOUS | Status: DC | PRN

## 2021-04-12 MED ORDER — KETOROLAC TROMETHAMINE 30 MG/ML IJ SOLN
30.0000 mg | Freq: Four times a day (QID) | INTRAMUSCULAR | Status: AC
  Administered 2021-04-13 (×2): 30 mg via INTRAVENOUS
  Filled 2021-04-12 (×4): qty 1

## 2021-04-12 MED ORDER — SOD CITRATE-CITRIC ACID 500-334 MG/5ML PO SOLN
30.0000 mL | Freq: Once | ORAL | Status: AC
  Administered 2021-04-12: 30 mL via ORAL
  Filled 2021-04-12: qty 30

## 2021-04-12 MED ORDER — OXYCODONE HCL 5 MG PO TABS
5.0000 mg | ORAL_TABLET | ORAL | Status: DC | PRN
  Administered 2021-04-12 – 2021-04-13 (×3): 5 mg via ORAL
  Administered 2021-04-13: 10 mg via ORAL
  Administered 2021-04-13: 5 mg via ORAL
  Administered 2021-04-14 (×2): 10 mg via ORAL
  Administered 2021-04-14 (×2): 5 mg via ORAL
  Administered 2021-04-15 (×3): 10 mg via ORAL
  Filled 2021-04-12 (×3): qty 2
  Filled 2021-04-12 (×2): qty 1
  Filled 2021-04-12 (×2): qty 2
  Filled 2021-04-12 (×3): qty 1
  Filled 2021-04-12: qty 2
  Filled 2021-04-12: qty 1

## 2021-04-12 MED ORDER — LACTATED RINGERS IV SOLN: INTRAVENOUS | Status: DC

## 2021-04-12 MED ORDER — ACETAMINOPHEN 10 MG/ML IV SOLN
INTRAVENOUS | Status: AC
  Filled 2021-04-12: qty 100

## 2021-04-12 MED ORDER — MORPHINE SULFATE (PF) 0.5 MG/ML IJ SOLN
INTRAMUSCULAR | Status: DC | PRN
  Administered 2021-04-12: 150 ug via INTRATHECAL

## 2021-04-12 MED ORDER — KETOROLAC TROMETHAMINE 30 MG/ML IJ SOLN
30.0000 mg | Freq: Four times a day (QID) | INTRAMUSCULAR | Status: AC | PRN
  Administered 2021-04-13: 30 mg via INTRAVENOUS

## 2021-04-12 MED ORDER — ONDANSETRON HCL 4 MG/2ML IJ SOLN
INTRAMUSCULAR | Status: AC
  Filled 2021-04-12: qty 2

## 2021-04-12 MED ORDER — PHENYLEPHRINE HCL-NACL 20-0.9 MG/250ML-% IV SOLN
INTRAVENOUS | Status: AC
  Filled 2021-04-12: qty 250

## 2021-04-12 MED ORDER — METHYLERGONOVINE MALEATE 0.2 MG PO TABS: 0.2000 mg | ORAL_TABLET | ORAL | Status: DC | PRN

## 2021-04-12 MED ORDER — FAMOTIDINE 20 MG IN NS 100 ML IVPB
20.0000 mg | Freq: Once | INTRAVENOUS | Status: AC
  Administered 2021-04-12: 20 mg via INTRAVENOUS
  Filled 2021-04-12: qty 100

## 2021-04-12 MED ORDER — KETOROLAC TROMETHAMINE 30 MG/ML IJ SOLN: 30.0000 mg | Freq: Four times a day (QID) | INTRAMUSCULAR | Status: AC | PRN

## 2021-04-12 MED ORDER — SIMETHICONE 80 MG PO CHEW: 80.0000 mg | CHEWABLE_TABLET | ORAL | Status: DC | PRN

## 2021-04-12 SURGICAL SUPPLY — 31 items
BENZOIN TINCTURE PRP APPL 2/3 (GAUZE/BANDAGES/DRESSINGS) ×3 IMPLANT
CHLORAPREP W/TINT 26ML (MISCELLANEOUS) ×3 IMPLANT
CLAMP CORD UMBIL (MISCELLANEOUS) IMPLANT
CLOTH BEACON ORANGE TIMEOUT ST (SAFETY) ×3 IMPLANT
DRSG OPSITE POSTOP 4X10 (GAUZE/BANDAGES/DRESSINGS) ×3 IMPLANT
ELECT REM PT RETURN 9FT ADLT (ELECTROSURGICAL) ×3
ELECTRODE REM PT RTRN 9FT ADLT (ELECTROSURGICAL) ×2 IMPLANT
EXTRACTOR VACUUM KIWI (MISCELLANEOUS) IMPLANT
EXTRACTOR VACUUM M CUP 4 TUBE (SUCTIONS) IMPLANT
GLOVE BIOGEL PI IND STRL 7.0 (GLOVE) ×2 IMPLANT
GLOVE BIOGEL PI INDICATOR 7.0 (GLOVE) ×1
GLOVE ORTHO TXT STRL SZ7.5 (GLOVE) ×3 IMPLANT
GOWN STRL REUS W/TWL LRG LVL3 (GOWN DISPOSABLE) ×6 IMPLANT
KIT ABG SYR 3ML LUER SLIP (SYRINGE) IMPLANT
NEEDLE HYPO 25X5/8 SAFETYGLIDE (NEEDLE) ×3 IMPLANT
NS IRRIG 1000ML POUR BTL (IV SOLUTION) ×3 IMPLANT
PACK C SECTION WH (CUSTOM PROCEDURE TRAY) ×3 IMPLANT
PAD OB MATERNITY 4.3X12.25 (PERSONAL CARE ITEMS) ×3 IMPLANT
PENCIL SMOKE EVAC W/HOLSTER (ELECTROSURGICAL) ×3 IMPLANT
RTRCTR C-SECT PINK 25CM LRG (MISCELLANEOUS) ×3 IMPLANT
STRIP CLOSURE SKIN 1/2X4 (GAUZE/BANDAGES/DRESSINGS) ×3 IMPLANT
SUT CHROMIC 1 CTX 36 (SUTURE) ×6 IMPLANT
SUT PLAIN 0 NONE (SUTURE) IMPLANT
SUT PLAIN 2 0 XLH (SUTURE) IMPLANT
SUT VIC AB 0 CT1 27 (SUTURE) ×3
SUT VIC AB 0 CT1 27XBRD ANBCTR (SUTURE) ×6 IMPLANT
SUT VIC AB 2-0 CT1 (SUTURE) ×3 IMPLANT
SUT VIC AB 4-0 KS 27 (SUTURE) IMPLANT
TOWEL OR 17X24 6PK STRL BLUE (TOWEL DISPOSABLE) ×3 IMPLANT
TRAY FOLEY W/BAG SLVR 14FR LF (SET/KITS/TRAYS/PACK) ×3 IMPLANT
WATER STERILE IRR 1000ML POUR (IV SOLUTION) ×3 IMPLANT

## 2021-04-12 NOTE — Anesthesia Procedure Notes (Signed)
Spinal  Start time: 04/12/2021 11:59 AM End time: 04/12/2021 12:05 PM Reason for block: surgical anesthesia Staffing Performed: other anesthesia staff  Anesthesiologist: Atilano Median, DO Preanesthetic Checklist Completed: patient identified, risks and benefits discussed, surgical consent, monitors and equipment checked, pre-op evaluation and timeout performed Spinal Block Patient position: sitting Prep: ChloraPrep Patient monitoring: heart rate, cardiac monitor, continuous pulse ox and blood pressure Approach: midline Location: L3-4 Injection technique: single-shot Needle Needle type: Pencan  Needle gauge: 25 G Assessment Sensory level: T4 Events: CSF return Additional Notes CSF aspirated before and after injection. Block sensory to T4 level.

## 2021-04-12 NOTE — Anesthesia Preprocedure Evaluation (Addendum)
Anesthesia Evaluation  Patient identified by MRN, date of birth, ID band Patient awake    Reviewed: Allergy & Precautions, NPO status , Patient's Chart, lab work & pertinent test results  Airway Mallampati: II  TM Distance: >3 FB Neck ROM: Full    Dental  (+) Teeth Intact, Dental Advisory Given   Pulmonary neg pulmonary ROS,    Pulmonary exam normal breath sounds clear to auscultation       Cardiovascular negative cardio ROS Normal cardiovascular exam Rhythm:Regular Rate:Normal     Neuro/Psych negative neurological ROS  negative psych ROS   GI/Hepatic negative GI ROS, Neg liver ROS,   Endo/Other  negative endocrine ROS  Renal/GU negative Renal ROS  negative genitourinary   Musculoskeletal negative musculoskeletal ROS (+)   Abdominal   Peds negative pediatric ROS (+)  Hematology negative hematology ROS (+)   Anesthesia Other Findings   Reproductive/Obstetrics (+) Pregnancy Last section 2020 with epidural                             Anesthesia Physical Anesthesia Plan  ASA: 2  Anesthesia Plan: Spinal   Post-op Pain Management:    Induction:   PONV Risk Score and Plan: 2 and Ondansetron, Dexamethasone and Treatment may vary due to age or medical condition  Airway Management Planned: Natural Airway and Nasal Cannula  Additional Equipment: None  Intra-op Plan:   Post-operative Plan:   Informed Consent: I have reviewed the patients History and Physical, chart, labs and discussed the procedure including the risks, benefits and alternatives for the proposed anesthesia with the patient or authorized representative who has indicated his/her understanding and acceptance.       Plan Discussed with: CRNA  Anesthesia Plan Comments:         Anesthesia Quick Evaluation

## 2021-04-12 NOTE — Anesthesia Postprocedure Evaluation (Signed)
Anesthesia Post Note  Patient: Carolyn Webb  Procedure(s) Performed: CESAREAN SECTION WITH BILATERAL TUBAL LIGATION (Right: Abdomen)     Patient location during evaluation: Mother Baby Anesthesia Type: Spinal Level of consciousness: oriented and awake and alert Pain management: pain level controlled Vital Signs Assessment: post-procedure vital signs reviewed and stable Respiratory status: spontaneous breathing and respiratory function stable Cardiovascular status: blood pressure returned to baseline and stable Postop Assessment: no headache, no backache, no apparent nausea or vomiting and able to ambulate Anesthetic complications: no   No notable events documented.  Last Vitals:  Vitals:   04/12/21 1400 04/12/21 1424  BP: 115/70 111/74  Pulse: (!) 54 (!) 50  Resp: 17 16  Temp: 36.5 C 36.6 C  SpO2: 97% 100%    Last Pain:  Vitals:   04/12/21 1425  TempSrc:   PainSc: 0-No pain                 Earl Lites P Ahamed Hofland

## 2021-04-12 NOTE — Transfer of Care (Signed)
Immediate Anesthesia Transfer of Care Note  Patient: Carolyn Webb  Procedure(s) Performed: CESAREAN SECTION WITH BILATERAL TUBAL LIGATION (Right: Abdomen)  Patient Location: PACU  Anesthesia Type:Spinal  Level of Consciousness: awake, alert  and oriented  Airway & Oxygen Therapy: Patient Spontanous Breathing  Post-op Assessment: Report given to RN and Post -op Vital signs reviewed and stable  Post vital signs: Reviewed and stable  Last Vitals:  Vitals Value Taken Time  BP 115/60 04/12/21 1316  Temp    Pulse 57 04/12/21 1319  Resp 17 04/12/21 1319  SpO2 100 % 04/12/21 1319  Vitals shown include unvalidated device data.  Last Pain:  Vitals:   04/12/21 0910  TempSrc: Oral  PainSc:          Complications: No notable events documented.

## 2021-04-12 NOTE — Op Note (Signed)
Preoperative diagnosis: Intrauterine pregnancy at 38 weeks, PROM, incompetent cervix with cerclage, previous c-section, desires permanent sterility Postoperative diagnosis: Same Procedure: Repeat low transverse cesarean section without extensions, bilateral partial salpingectomy, removal of cerclage sutures Surgeon: Lavina Hamman M.D. Anesthesia: Spinal  Findings: Patient had normal gravid anatomy and delivered a viable female infant with Apgars of 9 and 9 weight pending Estimated blood loss: 500 cc Specimens: Placenta sent to labor and delivery, segments of bilateral tubes for routine pathology Complications: None  Procedure in detail: The patient was taken to the operating room and placed in the sitting position. The anesthesiologist instilled spinal anesthesia.  She was then placed in the dorsosupine position with left tilt. Abdomen was then prepped and draped in the usual sterile fashion, and a foley catheter was inserted. The level of her anesthesia was found to be adequate. Abdomen was entered via a standard Pfannenstiel incision through her previous scar. Once the peritoneal cavity was entered the Alexis disposable self-retaining retractor was placed and good visualization was achieved. Bladder flap adhesions were released sharply.  A 4 cm transverse incision was then made in the lower uterine segment pushing the bladder inferior. Once the uterine cavity was entered the incision was extended digitally, clear amniotic fluid. The fetal vertex was grasped and delivered through the incision atraumatically. Mouth and nares were suctioned. The remainder of the infant then delivered atraumatically. Cord was doubly clamped and cut after one minute and the infant handed to the awaiting pediatric team. Cord blood was obtained. The placenta delivered spontaneously. Uterus was wiped dry with clean lap pad and all clots and debris were removed. Uterine incision was inspected and found to be free of extensions.  Uterine incision was closed in 1 layer with running locking #1 Chromic. Tubes and ovaries were inspected and found to be normal. Both fallopian tubes identified and traced to their fimbriated ends.  The middle portion of each tube was elevated with a Babcock clamp, a window was made in an avascular portion of the mesosalpinx with Bovie.  Plain gut suture was passed through this window, tied proximally, and then looped around the tube and tied distally to create a knuckle of tube.  On each side the knuckle of tube was removed sharply.  Both ostia were identified on each side and the stumps were hemostatic.  Uterine incision was inspected and found to be hemostatic. Bleeding from serosal edges was controlled with electrocautery. The Alexis retractor was removed. Subfascial space was irrigated and made hemostatic with electrocautery. Peritoneum was closed with 2-0 Vicryl.  Fascia was closed in running fashion starting at both ends and meeting in the middle with 0 Vicryl. Subcutaneous tissue was then irrigated and made hemostatic with electrocautery. Skin was closed with running 4-0 Vicryl subcuticular suture followed by steri-strips and a sterile dressing.  Her legs were then placed in the frog-leg position and she was elevated onto a bedpan.  A speculum was inserted in the vagina.  2 cerclage sutures were noted with the knots posterior.  I was eventually able to grab the suture and identify free suture and cut it and both sutures were removed.  Exam confirmed that all cerclage suture had been removed.  Patient tolerated the procedure well and was taken to the recovery in stable condition. Counts were correct x2, she received Ancef 2 g IV at the beginning of the procedure and she had PAS hose on throughout the procedure.

## 2021-04-12 NOTE — MAU Note (Signed)
Carolyn Webb is a 35 y.o. at [redacted]w[redacted]d here in MAU reporting: LOF within the past hour, states it was a big gush that happened after she used the bathroom, is feeling a little bit of leaking still. Not having regular contractions. No bleeding. Normal FM today.  Onset of complaint: today  Pain score: 0/10  Vitals:   04/12/21 0910  BP: 123/70  Pulse: 80  Resp: 15  Temp: 98.7 F (37.1 C)  SpO2: 99%     FHT:EFM applied in room  Lab orders placed from triage: fern slide

## 2021-04-12 NOTE — H&P (Signed)
Carolyn Webb is a 35 y.o. female, G4 P0211, EGA [redacted] weeks with EDC 10-10 presenting for leaking fluid since around 0800.  On eval in MAU, ROM confirmed with + fern.  Occ mild ctx.  PNC complicated by h/o incompetent cervix, has cerclage.  Last delivery LTCS, she desires repeat c-section and BTL.  OB History     Gravida  4   Para  2   Term  0   Preterm  2   AB  1   Living  1      SAB  1   IAB  0   Ectopic  0   Multiple  0   Live Births  2          Past Medical History:  Diagnosis Date   Medical history non-contributory    Past Surgical History:  Procedure Laterality Date   CERVICAL CERCLAGE N/A 09/21/2018   Procedure: CERCLAGE CERVICAL;  Surgeon: Carrington Clamp, MD;  Location: [facility];  Service: Gynecology;  Laterality: N/A;   CERVICAL CERCLAGE N/A 02/12/2019   Procedure: CERCLAGE CERVICAL;  Surgeon: Carrington Clamp, MD;  Location: [facility]S;  Service: Gynecology;  Laterality: N/A;   CERVICAL CERCLAGE N/A 10/29/2020   Procedure: CERCLAGE CERVICAL;  Surgeon: Carrington Clamp, MD;  Location: [facility] ORS;  Service: Gynecology;  Laterality: N/A;   CESAREAN SECTION N/A 02/12/2019   Procedure: CESAREAN SECTION;  Surgeon: Levi Aland, MD;  Location: [facility] LD ORS;  Service: Obstetrics;  Laterality: N/A;   NO PAST SURGERIES     Family History: family history includes Cleft lip in her paternal aunt; Diabetes in her maternal grandfather; Heart disease in her paternal grandmother; High Cholesterol in her maternal grandmother and another family member; Hypertension in her maternal grandmother; Muscular dystrophy in her cousin, maternal uncle, and maternal uncle; Parkinson's disease in her paternal grandfather. Social History:  reports that she has never smoked. She has never used smokeless tobacco. She reports that she does not currently use alcohol after a past usage of about 1.0 standard drink per week. She reports that she does not use drugs.     Maternal Diabetes:  No Genetic Screening: Normal Maternal Ultrasounds/Referrals: Normal Fetal Ultrasounds or other Referrals:  None Maternal Substance Abuse:  No Significant Maternal Medications:  None Significant Maternal Lab Results:  Group B Strep negative Other Comments:  None  Review of Systems  Respiratory: Negative.    Cardiovascular: Negative.   Maternal Medical History:  Reason for admission: Rupture of membranes.   Contractions: Frequency: irregular.   Perceived severity is mild.   Fetal activity: Perceived fetal activity is normal.   Prenatal Complications - Diabetes: none.    Blood pressure 123/70, pulse 80, temperature 98.7 F (37.1 C), temperature source Oral, resp. rate 15, SpO2 99 %, unknown if currently breastfeeding. Maternal Exam:  Uterine Assessment: Contraction strength is mild.  Contraction frequency is rare.  Abdomen: Patient reports no abdominal tenderness. Surgical scars: low transverse.   Estimated fetal weight is 7 lbs.   Fetal presentation: vertex Introitus: Normal vulva. Normal vagina.  Ferning test: positive.  Amniotic fluid character: clear.   Fetal Exam Fetal Monitor Review: Mode: ultrasound.   Baseline rate: 120-130.  Variability: moderate (6-25 bpm).   Pattern: accelerations present and no decelerations.   Fetal State Assessment: Category I - tracings are normal.  Physical Exam Vitals reviewed.  Constitutional:      Appearance: Normal appearance.  Cardiovascular:     Rate and Rhythm: Normal  rate and regular rhythm.  Pulmonary:     Effort: Pulmonary effort is normal. No respiratory distress.  Abdominal:     Palpations: Abdomen is soft.  Genitourinary:    General: Normal vulva.  Neurological:     Mental Status: She is alert.    Prenatal labs: ABO, Rh: --/--/A POS (04/14 2122) Antibody: NEG (04/14 4825) Rubella: Immune (04/01 0000) RPR: Nonreactive (04/01 0000)  HBsAg: Negative (04/01 0000)  HIV: Non-reactive (04/01 0000)  GBS:    neg  Assessment/Plan: IUP at 38 weeks, SROM, cerclage, previous LTCS, desires permanent sterility.  She wants repeat c-section and bilateral salpingectomy.  Discussed surgical procedure, risks, failure rate of BTL.  OR notified, will proceed when labs back and OR is ready.  Remove cerclage at end of case   Leighton Roach Arlan Birks 04/12/2021, 10:00 AM

## 2021-04-12 NOTE — Progress Notes (Signed)
Call to Dr. Nance Pew, Anesthesiologist, because patient is having incisional pain of 7 and patient within 12 hours of receiving Duromorph. Patient is tolerating fluid and meals well. MD approved giving Oxycodone 5mg  po now for pain.

## 2021-04-13 LAB — CBC
HCT: 34.3 % — ABNORMAL LOW (ref 36.0–46.0)
Hemoglobin: 11.4 g/dL — ABNORMAL LOW (ref 12.0–15.0)
MCH: 31.8 pg (ref 26.0–34.0)
MCHC: 33.2 g/dL (ref 30.0–36.0)
MCV: 95.5 fL (ref 80.0–100.0)
Platelets: 339 10*3/uL (ref 150–400)
RBC: 3.59 MIL/uL — ABNORMAL LOW (ref 3.87–5.11)
RDW: 15.1 % (ref 11.5–15.5)
WBC: 11.1 10*3/uL — ABNORMAL HIGH (ref 4.0–10.5)
nRBC: 0 % (ref 0.0–0.2)

## 2021-04-13 LAB — BIRTH TISSUE RECOVERY COLLECTION (PLACENTA DONATION)

## 2021-04-13 LAB — RPR: RPR Ser Ql: NONREACTIVE

## 2021-04-13 NOTE — Progress Notes (Addendum)
Post Partum Day 1 Subjective: no complaints, up ad lib, voiding, and tolerating PO pain well controlled  Objective: Blood pressure 102/73, pulse (!) 52, temperature 97.7 F (36.5 C), temperature source Oral, resp. rate 18, SpO2 99 %, unknown if currently breastfeeding.  Physical Exam:  General: alert, cooperative, and appears stated age Lochia: appropriate Uterine Fundus: firm Inc: C/D/I DVT Evaluation: No evidence of DVT seen on physical exam.  Recent Labs    04/12/21 0935 04/13/21 0514  HGB 12.7 11.4*  HCT 38.5 34.3*    Assessment/Plan: Routine PP care Breastfeeding    LOS: 1 day   Carolyn Webb 04/13/2021, 10:36 AM

## 2021-04-13 NOTE — Lactation Note (Signed)
This note was copied from a baby's chart. Lactation Consultation Note  Patient Name: Carolyn Webb [id number] Date: 04/13/2021 Reason for consult: Initial assessment;Mother's request;Early term 37-38.6wks Age:33 hours  LC reviewed feeding positions to get a deeper latch. Infant fed 1 hr prior to [facility] arrival. Compression stripe only noted on right breast.   Mom to try positions with more depth, flanging out lips with cheeks and nose touching.  Plan 1. To feed based on cues 8-12x 24 hr period. Mom to offer breasts and look for signs of milk transfer.  2. If unable to latch, Mom can offer EBM via spoon from hand expression.  3. I and O sheet reviewed.  4. LC brochure of inpatient and outpatient services reviewed.   All questions answered at the end of the visit. Mom electric pump for home, not returning to work for 8 weeks.  Infant adequate urine and stool output with feedings so far. Mom work on latching for now and not use the DEBP set up in the room.   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 As a result, she pumped and bottle fed infant for 12 months  Feeding Mother's Current Feeding Choice: Breast Milk  LATCH Score                    Lactation Tools Discussed/Used    Interventions Interventions: Breast feeding basics reviewed;Breast compression;Skin to skin;Breast massage;Position options;Hand express;Expressed milk;Education  Discharge Pump: Personal  Consult Status Consult Status: Follow-up Date: 04/14/21 Follow-up type: In-patient    Tishawn Friedhoff  Nicholson-Springer 04/13/2021, 4:11 PM

## 2021-04-14 LAB — SURGICAL PATHOLOGY

## 2021-04-14 NOTE — Plan of Care (Signed)
Care Plan progressing  Eleana Tocco T RN  

## 2021-04-14 NOTE — Progress Notes (Signed)
Post Partum Day 2 Subjective: no complaints, up ad lib, voiding, and tolerating PO pain well controlled just feeling somewhat tired overall, would like DC tomorrow  Objective: Blood pressure 121/76, pulse 63, temperature 98.2 F (36.8 C), temperature source Oral, resp. rate 17, SpO2 99 %, unknown if currently breastfeeding.  Physical Exam:  General: alert, cooperative, and appears stated age Lochia: appropriate Uterine Fundus: firm Inc: C/D/I. Old serosanguinous discharge on right aspect of dressing within old markings DVT Evaluation: No evidence of DVT seen on physical exam.  Recent Labs    04/12/21 0935 04/13/21 0514  HGB 12.7 11.4*  HCT 38.5 34.3*     Assessment/Plan: 35yo [id number] POD#2 after ERLTCS+BTL, h/o csx plus cerclage for incompetent cervix Breastfeeding Replace dressing and monitor, plan for DC home tomorrow    LOS: 2 days   Carlisle Cater 04/14/2021, 7:39 AM

## 2021-04-15 MED ORDER — OXYCODONE HCL 5 MG PO TABS: 5.0000 mg | ORAL_TABLET | ORAL | 0 refills | Status: AC | PRN

## 2021-04-15 MED ORDER — IBUPROFEN 600 MG PO TABS: 600.0000 mg | ORAL_TABLET | Freq: Four times a day (QID) | ORAL | 0 refills | Status: AC | PRN

## 2021-04-15 NOTE — Progress Notes (Signed)
Subjective: Postpartum Day 3: Cesarean Delivery Patient reports some incisional pain with walking but otherwise doing well. Pain is controlled with ibu/tylenol/oxycodone. Ambulating, voiding, tolerating PO. Breastfeeding. Minimal lochia. Eager for discharge home   Objective: Patient Vitals for the past 24 hrs:  BP Temp Temp src Pulse Resp SpO2  04/15/21 0529 129/74 98 F (36.7 C) Oral (!) 53 18 99 %  04/14/21 2111 113/61 98.2 F (36.8 C) Oral (!) 58 18 99 %  04/14/21 1510 129/88 98.9 F (37.2 C) Oral 76 17 100 %    Physical Exam:  General: alert, cooperative, and no distress Lochia: appropriate Uterine Fundus: firm Incision: healing well, no significant drainage, no dehiscence, no significant erythema DVT Evaluation: No evidence of DVT seen on physical exam.  Recent Labs    04/13/21 0514  HGB 11.4*  HCT 34.3*    Assessment/Plan:  Carolyn Webb Carolyn Webb POD#3 sp repeat cesarean at [redacted]w[redacted]d 1. PPC: continue routine postpartum care 2. Rh positive, Rubella Immune, s/p tdap prenatally 3. Dispo: stable for discharge, discharge instructions reviewed  Carolyn Webb 04/15/2021, 10:35 AM

## 2021-04-15 NOTE — Lactation Note (Signed)
This note was copied from a baby's chart. Lactation Consultation Note  Patient Name: Carolyn Webb [id number] Date: 04/15/2021 Reason for consult: Follow-up assessment;Nipple pain/trauma;Early term 37-38.6wks;Infant weight loss;Other (Comment) (7 % weight loss/ per mom milk is coming in) Age:33 hours Per mom baby had recently breast fed .  LC reviewed and updated the doc flow sheets per mom and dad / WNL for D/C  Per mom has sore nipples / baby latching well / and mom showed the LC the positional strips. Both positional strips intact.  LC provided all the Breast feeding tools for sore nipple tx with instructions , see below.  LC recommended if the sore nipples are not cleared up by 4 days to call for [facility] O/P appt .  Per mom her 1st baby had a lip and tongue tie revised.  LC assessed baby's oral cavity, no tongue restriction noted. Mom mentioned the baby is latching well.   Maternal Data    Feeding Mother's Current Feeding Choice: Breast Milk  LATCH Score  Lactation Tools Discussed/Used    Interventions Interventions: Breast feeding basics reviewed;Pre-pump if needed;Shells;Comfort gels;Hand pump;Education;LC Services brochure  Discharge Discharge Education: Engorgement and breast care;Warning signs for feeding baby;Outpatient recommendation;Other (comment) (if sore nipples are cleared up by 4 days - call for [facility] O/P appt) Pump: Personal;Manual;DEBP WIC Program: No  Consult Status Consult Status: Complete Date: 04/15/21    Carolyn Webb 04/15/2021, 2:03 PM

## 2021-04-15 NOTE — Discharge Summary (Signed)
Postpartum Discharge Summary  Date of Service updated 04/15/21      Patient Name: Carolyn Webb DOB: Aug 08, 1985 MRN: [id number]  Date of admission: 04/12/2021 Delivery date:04/12/2021  Delivering provider: Willis Modena, TODD  Date of discharge: 04/15/2021  Admitting diagnosis: S/P cesarean section [Z98.891] Intrauterine pregnancy: [redacted]w[redacted]d    Secondary diagnosis:  Active Problems:   S/P cesarean section  Additional problems: Cervical insufficiency s/p cerclage    Discharge diagnosis: Term Pregnancy Delivered                                              Post partum procedures: none Augmentation: N/A Complications: None  Hospital course:  35y.o. yo [id number] admitted in with SROM on 04/12/2021. The patient went for cesarean section due to Elective Repeat. Delivery details as follows: Membrane Rupture Time/Date: 8:00 AM ,04/12/2021   Delivery Method:C-Section, Low Transverse  Details of operation can be found in separate operative note. Patient had an uncomplicated postpartum course.  She is ambulating,tolerating a regular diet, passing flatus, and urinating well.  Patient is discharged home in stable condition 04/15/21.  Newborn Data: Birth date:04/12/2021  Birth time:12:24 PM  Gender:Female  Living status:Living  Apgars:8 ,9  Weight:3380 g   Magnesium Sulfate received: No BMZ received: No Rhophylac:N/A MMR:N/A T-DaP:Given prenatally Flu: No Transfusion:No  Physical exam  Vitals:   04/14/21 0604 04/14/21 1510 04/14/21 2111 04/15/21 0529  BP: 121/76 129/88 113/61 129/74  Pulse: 63 76 (!) 58 (!) 53  Resp: _0 Temp: 98.2 F (36.8 C) 98.9 F (37.2 C) 98.2 F (36.8 C) 98 F (36.7 C)  TempSrc: Oral Oral Oral Oral  SpO2: 99% 100% 99% 99%   General: alert, cooperative, and no distress Lochia: appropriate Uterine Fundus: firm Incision: Healing well with no significant drainage, No significant erythema, Dressing is clean, dry, and intact DVT  Evaluation: No evidence of DVT seen on physical exam. Labs: Lab Results  Component Value Date   WBC 11.1 (H) 04/13/2021   HGB 11.4 (L) 04/13/2021   HCT 34.3 (L) 04/13/2021   MCV 95.5 04/13/2021   PLT 339 04/13/2021   No flowsheet data found. Edinburgh Score: Edinburgh Postnatal Depression Scale Screening Tool 04/13/2021  I have been able to laugh and see the funny side of things. 0  I have looked forward with enjoyment to things. 0  I have blamed myself unnecessarily when things went wrong. 0  I have been anxious or worried for no good reason. 0  I have felt scared or panicky for no good reason. 0  Things have been getting on top of me. 0  I have been so unhappy that I have had difficulty sleeping. 0  I have felt sad or miserable. 0  I have been so unhappy that I have been crying. 0  The thought of harming myself has occurred to me. 0  Edinburgh Postnatal Depression Scale Total 0      After visit meds:  Allergies as of 04/15/2021       Reactions   Sulfa Antibiotics Nausea Only        Medication List     STOP taking these medications    indomethacin 50 MG suppository Commonly known as: INDOCIN       TAKE these medications    acetaminophen 500 MG tablet Commonly known  as: TYLENOL Take 500-1,000 mg by mouth every 6 (six) hours as needed (PAIN).   ibuprofen 600 MG tablet Commonly known as: ADVIL Take 1 tablet (600 mg total) by mouth every 6 (six) hours as needed.   multivitamin-prenatal 27-0.8 MG Tabs tablet Take 1 tablet by mouth at bedtime.   oxyCODONE 5 MG immediate release tablet Commonly known as: Oxy IR/ROXICODONE Take 1-2 tablets (5-10 mg total) by mouth every 4 (four) hours as needed for severe pain.         Discharge home in stable condition Infant Feeding: Breast Infant Disposition:home with mother Discharge instruction: per After Visit Summary and Postpartum booklet. Activity: Advance as tolerated. Pelvic rest for 6 weeks.  Diet: routine  diet Anticipated Birth Control: Unsure Postpartum Appointment:4 weeks Additional Postpartum F/U:  none Future Appointments:No future appointments. Follow up Visit:  Follow-up Information     Bobbye Charleston, MD Follow up in 4 week(s).   Specialty: Obstetrics and Gynecology Contact information: [address] [place] [address] [phone]                     04/15/2021 Rowland Lathe, MD

## 2021-04-15 NOTE — Plan of Care (Signed)
Care plan progressing  Haillie Radu T RN 

## 2021-04-19 ENCOUNTER — Inpatient Hospital Stay (HOSPITAL_COMMUNITY)
Admission: AD | Admit: 2021-04-19 | Discharge: 2021-04-20 | Disposition: A | Discharge status: Home / Self Care | Payer: 59 | Attending: Obstetrics and Gynecology | Admitting: Obstetrics and Gynecology

## 2021-04-19 ENCOUNTER — Encounter (HOSPITAL_COMMUNITY): Payer: Self-pay | Admitting: Obstetrics and Gynecology

## 2021-04-19 ENCOUNTER — Inpatient Hospital Stay (HOSPITAL_COMMUNITY): Payer: 59

## 2021-04-19 DIAGNOSIS — O99893 Other specified diseases and conditions complicating puerperium: Secondary | ICD-10-CM | POA: Diagnosis not present

## 2021-04-19 DIAGNOSIS — R1031 Right lower quadrant pain: Secondary | ICD-10-CM | POA: Diagnosis present

## 2021-04-19 DIAGNOSIS — O8622 Infection of bladder following delivery: Secondary | ICD-10-CM | POA: Diagnosis not present

## 2021-04-19 DIAGNOSIS — N3001 Acute cystitis with hematuria: Secondary | ICD-10-CM | POA: Diagnosis not present

## 2021-04-19 LAB — COMPREHENSIVE METABOLIC PANEL
ALT: 22 U/L (ref 0–44)
AST: 22 U/L (ref 15–41)
Albumin: 2.9 g/dL — ABNORMAL LOW (ref 3.5–5.0)
Alkaline Phosphatase: 109 U/L (ref 38–126)
Anion gap: 8 (ref 5–15)
BUN: 19 mg/dL (ref 6–20)
CO2: 21 mmol/L — ABNORMAL LOW (ref 22–32)
Calcium: 8.9 mg/dL (ref 8.9–10.3)
Chloride: 109 mmol/L (ref 98–111)
Creatinine, Ser: 0.81 mg/dL (ref 0.44–1.00)
GFR, Estimated: >60 mL/min (ref >60)
Glucose, Bld: 80 mg/dL (ref 70–99)
Potassium: 4.2 mmol/L (ref 3.5–5.1)
Sodium: 138 mmol/L (ref 135–145)
Total Bilirubin: 0.6 mg/dL (ref 0.3–1.2)
Total Protein: 6.7 g/dL (ref 6.5–8.1)

## 2021-04-19 LAB — URINALYSIS, ROUTINE W REFLEX MICROSCOPIC
Bilirubin Urine: NEGATIVE
Glucose, UA: NEGATIVE mg/dL
Ketones, ur: NEGATIVE mg/dL
Nitrite: NEGATIVE
Protein, ur: NEGATIVE mg/dL
RBC / HPF: >50 RBC/hpf — ABNORMAL HIGH (ref 0–5)
Specific Gravity, Urine: 1.018 (ref 1.005–1.030)
pH: 6 (ref 5.0–8.0)

## 2021-04-19 LAB — CBC WITH DIFFERENTIAL/PLATELET
Abs Immature Granulocytes: 0.03 10*3/uL (ref 0.00–0.07)
Basophils Absolute: 0 10*3/uL (ref 0.0–0.1)
Basophils Relative: 1 %
Eosinophils Absolute: 0.1 10*3/uL (ref 0.0–0.5)
Eosinophils Relative: 2 %
HCT: 40.4 % (ref 36.0–46.0)
Hemoglobin: 13.3 g/dL (ref 12.0–15.0)
Immature Granulocytes: 1 %
Lymphocytes Relative: 24 %
Lymphs Abs: 1.5 10*3/uL (ref 0.7–4.0)
MCH: 31.3 pg (ref 26.0–34.0)
MCHC: 32.9 g/dL (ref 30.0–36.0)
MCV: 95.1 fL (ref 80.0–100.0)
Monocytes Absolute: 0.5 10*3/uL (ref 0.1–1.0)
Monocytes Relative: 8 %
Neutro Abs: 4.1 10*3/uL (ref 1.7–7.7)
Neutrophils Relative %: 64 %
Platelets: 499 10*3/uL — ABNORMAL HIGH (ref 150–400)
RBC: 4.25 MIL/uL (ref 3.87–5.11)
RDW: 15 % (ref 11.5–15.5)
WBC: 6.3 10*3/uL (ref 4.0–10.5)
nRBC: 0 % (ref 0.0–0.2)

## 2021-04-19 MED ORDER — IOHEXOL 300 MG/ML  SOLN
75.0000 mL | Freq: Once | INTRAMUSCULAR | Status: AC | PRN
  Administered 2021-04-19: 75 mL via INTRAVENOUS

## 2021-04-19 MED ORDER — KETOROLAC TROMETHAMINE 60 MG/2ML IM SOLN
60.0000 mg | Freq: Once | INTRAMUSCULAR | Status: AC
  Administered 2021-04-19: 60 mg via INTRAMUSCULAR
  Filled 2021-04-19: qty 2

## 2021-04-19 NOTE — MAU Provider Note (Addendum)
History     CSN: [id number]  Arrival date and time: 04/19/21 1939   Event Date/Time   First Provider Initiated Contact with Patient 04/19/21 2023      Chief Complaint  Patient presents with   Abdominal Pain   HPI Carolyn Webb is a 35 y.o. [id number] at 1 week post partum who presents with abdominal pain. Had repeat c/section with BTL on 9/26. Reports RLQ pain since Friday that has worsened. Pain is constant, sharp, and worse with movement. Rates pain 6/10. Has been taking oxycodone with minimal relief. Last BM was 2 days ago; reports issue with constipation since delivery that she hasn't been treating but plans on restarting stool softeners. Continues to have bright red bleeding that is staining a pad. Not passing blood clots. Reports dysuria since surgery. No other urinary complaints.  Denies fever/chills, or n/v/d.    OB History     Gravida  4   Para  3   Term  1   Preterm  2   AB  1   Living  2      SAB  1   IAB  0   Ectopic  0   Multiple  0   Live Births  3           Past Medical History:  Diagnosis Date   Medical history non-contributory     Past Surgical History:  Procedure Laterality Date   CERVICAL CERCLAGE N/A 09/21/2018   Procedure: CERCLAGE CERVICAL;  Surgeon: Carrington Clamp, MD;  Location: [facility];  Service: Gynecology;  Laterality: N/A;   CERVICAL CERCLAGE N/A 02/12/2019   Procedure: CERCLAGE CERVICAL;  Surgeon: Carrington Clamp, MD;  Location: [facility];  Service: Gynecology;  Laterality: N/A;   CERVICAL CERCLAGE N/A 10/29/2020   Procedure: CERCLAGE CERVICAL;  Surgeon: Carrington Clamp, MD;  Location: [facility];  Service: Gynecology;  Laterality: N/A;   CESAREAN SECTION N/A 02/12/2019   Procedure: CESAREAN SECTION;  Surgeon: Levi Aland, MD;  Location: [facility];  Service: Obstetrics;  Laterality: N/A;   CESAREAN SECTION WITH BILATERAL TUBAL LIGATION Right 04/12/2021   Procedure: CESAREAN SECTION WITH BILATERAL TUBAL  LIGATION;  Surgeon: Lavina Hamman, MD;  Location: [facility];  Service: Obstetrics;  Laterality: Right;    Family History  Problem Relation Age of Onset   Hypertension Maternal Grandmother    High Cholesterol Maternal Grandmother    Diabetes Maternal Grandfather    Heart disease Paternal Grandmother    Parkinson's disease Paternal Grandfather    High Cholesterol Other    Muscular dystrophy Maternal Uncle    Cleft lip Paternal Aunt    Muscular dystrophy Maternal Uncle    Muscular dystrophy Cousin     Social History   Tobacco Use   Smoking status: Never   Smokeless tobacco: Never  Vaping Use   Vaping Use: Never used  Substance Use Topics   Alcohol use: Not Currently    Alcohol/week: 1.0 standard drink    Types: 1 Cans of beer per week   Drug use: No    Allergies:  Allergies  Allergen Reactions   Sulfa Antibiotics Nausea Only    Medications Prior to Admission  Medication Sig Dispense Refill Last Dose   acetaminophen (TYLENOL) 500 MG tablet Take 500-1,000 mg by mouth every 6 (six) hours as needed (PAIN).   04/18/2021   ibuprofen (ADVIL) 600 MG tablet Take 1 tablet (600 mg total) by mouth every 6 (six) hours as needed.  90 tablet 0 04/18/2021   oxyCODONE (OXY IR/ROXICODONE) 5 MG immediate release tablet Take 1-2 tablets (5-10 mg total) by mouth every 4 (four) hours as needed for severe pain. 16 tablet 0 Past Week   Prenatal Vit-Fe Fumarate-FA (MULTIVITAMIN-PRENATAL) 27-0.8 MG TABS tablet Take 1 tablet by mouth at bedtime.   04/19/2021    Review of Systems  Constitutional: Negative.   Gastrointestinal:  Positive for abdominal distention, abdominal pain and constipation. Negative for diarrhea, nausea and vomiting.  Genitourinary:  Positive for dysuria and vaginal bleeding. Negative for difficulty urinating, flank pain, frequency, hematuria, vaginal discharge and vaginal pain.  Physical Exam   Blood pressure 138/82, pulse 63, temperature 98.3 F (36.8 C), temperature source  Oral, resp. rate 18, height 5\' 1"  (1.549 m), weight 74.7 kg, SpO2 99 %, unknown if currently breastfeeding.  Physical Exam Vitals and nursing note reviewed.  Constitutional:      Appearance: She is well-developed. She is not toxic-appearing.  HENT:     Head: Normocephalic and atraumatic.  Eyes:     General: No scleral icterus. Pulmonary:     Effort: Pulmonary effort is normal. No respiratory distress.  Abdominal:     General: Bowel sounds are normal. There is distension.     Palpations: Abdomen is soft. There is no mass.     Tenderness: There is abdominal tenderness in the right lower quadrant. There is rebound.     Comments: Steri strips remain in place.   Skin:    General: Skin is warm and dry.  Neurological:     General: No focal deficit present.     Mental Status: She is alert.  Psychiatric:        Mood and Affect: Mood normal.        Behavior: Behavior normal.    MAU Course  Procedures Results for orders placed or performed during the hospital encounter of 04/19/21 (from the past 24 hour(s))  Urinalysis, Routine w reflex microscopic Urine, Clean Catch     Status: Abnormal   Collection Time: 04/19/21  7:55 PM  Result Value Ref Range   Color, Urine YELLOW YELLOW   APPearance HAZY (A) CLEAR   Specific Gravity, Urine 1.018 1.005 - 1.030   pH 6.0 5.0 - 8.0   Glucose, UA NEGATIVE NEGATIVE mg/dL   Hgb urine dipstick LARGE (A) NEGATIVE   Bilirubin Urine NEGATIVE NEGATIVE   Ketones, ur NEGATIVE NEGATIVE mg/dL   Protein, ur NEGATIVE NEGATIVE mg/dL   Nitrite NEGATIVE NEGATIVE   Leukocytes,Ua MODERATE (A) NEGATIVE   RBC / HPF >50 (H) 0 - 5 RBC/hpf   WBC, UA 21-50 0 - 5 WBC/hpf   Bacteria, UA FEW (A) NONE SEEN   Squamous Epithelial / LPF 0-5 0 - 5   Mucus PRESENT   CBC with Differential/Platelet     Status: Abnormal   Collection Time: 04/19/21  9:27 PM  Result Value Ref Range   WBC 6.3 4.0 - 10.5 K/uL   RBC 4.25 3.87 - 5.11 MIL/uL   Hemoglobin 13.3 12.0 - 15.0 g/dL    HCT 06/19/21 28.4 - 13.2 %   MCV 95.1 80.0 - 100.0 fL   MCH 31.3 26.0 - 34.0 pg   MCHC 32.9 30.0 - 36.0 g/dL   RDW 44.0 10.2 - 72.5 %   Platelets 499 (H) 150 - 400 K/uL   nRBC 0.0 0.0 - 0.2 %   Neutrophils Relative % 64 %   Neutro Abs 4.1 1.7 - 7.7 K/uL   Lymphocytes  Relative 24 %   Lymphs Abs 1.5 0.7 - 4.0 K/uL   Monocytes Relative 8 %   Monocytes Absolute 0.5 0.1 - 1.0 K/uL   Eosinophils Relative 2 %   Eosinophils Absolute 0.1 0.0 - 0.5 K/uL   Basophils Relative 1 %   Basophils Absolute 0.0 0.0 - 0.1 K/uL   Immature Granulocytes 1 %   Abs Immature Granulocytes 0.03 0.00 - 0.07 K/uL  Comprehensive metabolic panel     Status: Abnormal   Collection Time: 04/19/21  9:27 PM  Result Value Ref Range   Sodium 138 135 - 145 mmol/L   Potassium 4.2 3.5 - 5.1 mmol/L   Chloride 109 98 - 111 mmol/L   CO2 21 (L) 22 - 32 mmol/L   Glucose, Bld 80 70 - 99 mg/dL   BUN 19 6 - 20 mg/dL   Creatinine, Ser 1.01 0.44 - 1.00 mg/dL   Calcium 8.9 8.9 - 75.1 mg/dL   Total Protein 6.7 6.5 - 8.1 g/dL   Albumin 2.9 (L) 3.5 - 5.0 g/dL   AST 22 15 - 41 U/L   ALT 22 0 - 44 U/L   Alkaline Phosphatase 109 38 - 126 U/L   Total Bilirubin 0.6 0.3 - 1.2 mg/dL   GFR, Estimated >02 >58 mL/min   Anion gap 8 5 - 15   CT ABDOMEN PELVIS W CONTRAST  Result Date: 04/20/2021 CLINICAL DATA:  Ten days post C-section.  Right lower quadrant pain. EXAM: CT ABDOMEN AND PELVIS WITH CONTRAST TECHNIQUE: Multidetector CT imaging of the abdomen and pelvis was performed using the standard protocol following bolus administration of intravenous contrast. CONTRAST:  43mL OMNIPAQUE IOHEXOL 300 MG/ML  SOLN COMPARISON:  None. FINDINGS: Lower chest: No acute abnormality. Hepatobiliary: No focal liver abnormality is seen. No gallstones, gallbladder wall thickening, or biliary dilatation. Pancreas: Unremarkable. No pancreatic ductal dilatation or surrounding inflammatory changes. Spleen: Normal in size without focal abnormality. Adrenals/Urinary  Tract: Adrenal glands are unremarkable. Kidneys are normal, without renal calculi, focal lesion, or hydronephrosis. Bladder is unremarkable. Stomach/Bowel: Stomach is within normal limits. Appendix appears normal. No evidence of bowel wall thickening, distention, or inflammatory changes. Vascular/Lymphatic: No significant vascular findings are present. No enlarged abdominal or pelvic lymph nodes. Reproductive: The uterus is enlarged compatible with recent delivery/C-section. No surrounding inflammation. Adnexa are grossly within normal limits. Other: There is trace right adnexal fluid. There is no free intraperitoneal air. There is anterior abdominal wall stranding and a small amount of air compatible with recent C-section. There is a tiny fluid collection abutting the muscular wall containing small focus of air. This collection measures 4.2 cm in craniocaudad dimension and 4.0 x 0.8 cm in largest transverse in AP dimension. There is no focal abdominal wall hernia. Musculoskeletal: No acute or significant osseous findings. IMPRESSION: 1. Enlarged uterus with anterior abdominal wall stranding and air compatible with recent C-section. There is a thin fluid collection within the anterior abdominal wall containing air. Findings may be related to postoperative seroma, resolving hematoma or early abscess in the appropriate clinical setting. 2. There is trace free fluid in the right adnexa. 3. No other acute localizing process in the abdomen or pelvis. Electronically Signed   By: Darliss Cheney M.D.   On: 04/20/2021 00:04    MDM Patient 1 week s/p RCS with BTL presenting with RLQ pain. On exam she is significantly tender with rebound in RLQ. Labs & imaging ordered. Toradol IM given.   Care turned over to [facility] CNM  Judeth Horn, NP 04/19/2021 9:34 PM   Consulted with Dr. Debroah Loop regarding CT results- low suspicion for any infectious process since patient is afebrile and well healing with no discharge.    Assessment and Plan   1. Acute cystitis with hematuria   2. Postpartum state    -Discharge home in stable condition -Rx for duricef and pyridium sent to patient's pharmacy -UTI precautions discussed -Patient advised to follow-up with OB as scheduled for prenatal care -Patient may return to MAU as needed or if her condition were to change or worsen  Rolm Bookbinder, CNM 04/20/21 1:43 AM

## 2021-04-19 NOTE — MAU Note (Signed)
Pt having right lower abdominal pain since Friday. Does have some pain with urination. Pain is 6/10 Called the office after hours and they suggested to come to MAU for a urinalysis. PP C/S 7 days post op. Has had 2 BM since surgery but were hard. Bleeding is good and incision feels ok.

## 2021-04-20 ENCOUNTER — Encounter (HOSPITAL_COMMUNITY)
Admission: RE | Admit: 2021-04-20 | Discharge: 2021-04-20 | Disposition: A | Discharge status: Home / Self Care | Payer: 59 | Source: Ambulatory Visit | Attending: Obstetrics and Gynecology | Admitting: Obstetrics and Gynecology

## 2021-04-20 MED ORDER — CEFADROXIL 500 MG PO CAPS: 500.0000 mg | ORAL_CAPSULE | Freq: Two times a day (BID) | ORAL | 0 refills | Status: AC

## 2021-04-20 MED ORDER — PHENAZOPYRIDINE HCL 100 MG PO TABS
200.0000 mg | ORAL_TABLET | Freq: Once | ORAL | Status: AC
  Administered 2021-04-20: 200 mg via ORAL
  Filled 2021-04-20: qty 2

## 2021-04-20 MED ORDER — CEFADROXIL 500 MG PO CAPS
500.0000 mg | ORAL_CAPSULE | Freq: Once | ORAL | Status: AC
  Administered 2021-04-20: 500 mg via ORAL
  Filled 2021-04-20: qty 1

## 2021-04-20 MED ORDER — PHENAZOPYRIDINE HCL 200 MG PO TABS: 200.0000 mg | ORAL_TABLET | Freq: Three times a day (TID) | ORAL | 0 refills | Status: AC

## 2021-04-22 ENCOUNTER — Encounter (HOSPITAL_COMMUNITY): Admission: RE | Payer: Self-pay | Source: Home / Self Care

## 2021-04-22 ENCOUNTER — Telehealth (HOSPITAL_COMMUNITY): Payer: Self-pay | Admitting: *Deleted

## 2021-04-22 ENCOUNTER — Inpatient Hospital Stay (HOSPITAL_COMMUNITY): Admission: RE | Admit: 2021-04-22 | Payer: 59 | Source: Home / Self Care | Admitting: Obstetrics and Gynecology

## 2021-04-22 SURGERY — Surgical Case
Anesthesia: Regional | Laterality: Bilateral

## 2021-04-22 NOTE — Telephone Encounter (Signed)
Mom reports feeling good. No concerns about herself. Incision healing well. EPDS=0 (hospital score=0) Mom reports baby is well. Feeding, peeing, and pooping without difficulty. Sleeps in bassinet on back. Reviewed safe sleep. Mom has no concerns about baby.  Duffy Rhody, RN 04-22-2021 at 10:22am

## 2021-05-04 IMAGING — US US MFM OB TRANSVAGINAL
1 series · 9 of 9 positions shown · non-contrast
Comparison: none

[Series 1: us mfm ob transvaginal · 9 acquisitions, 9 frames shown]
[im 1/9]
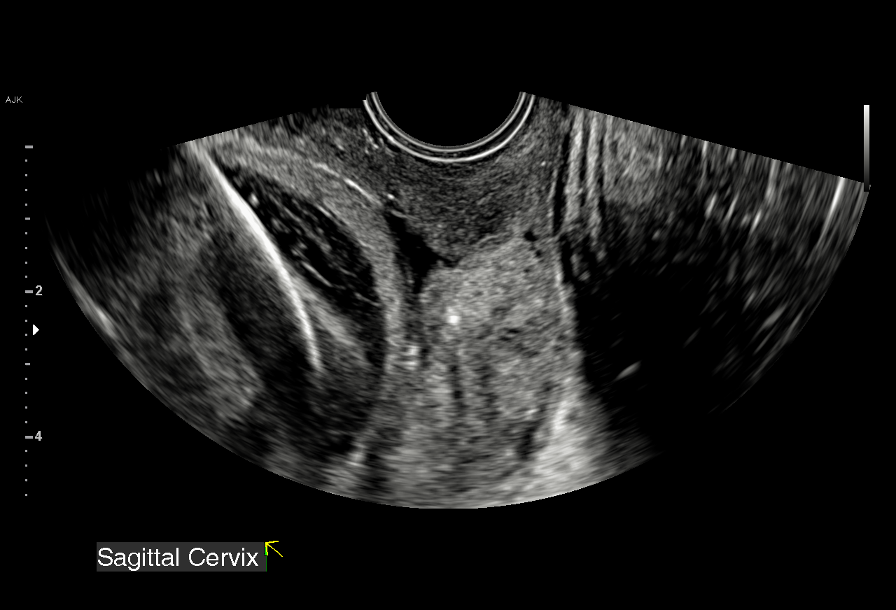
[im 2/9]
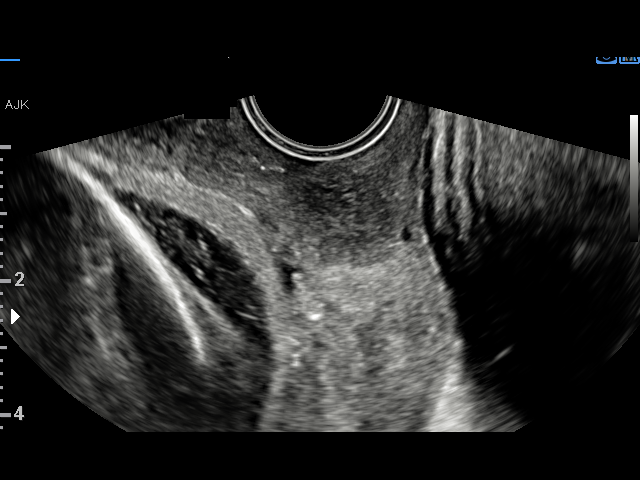
[im 3/9]
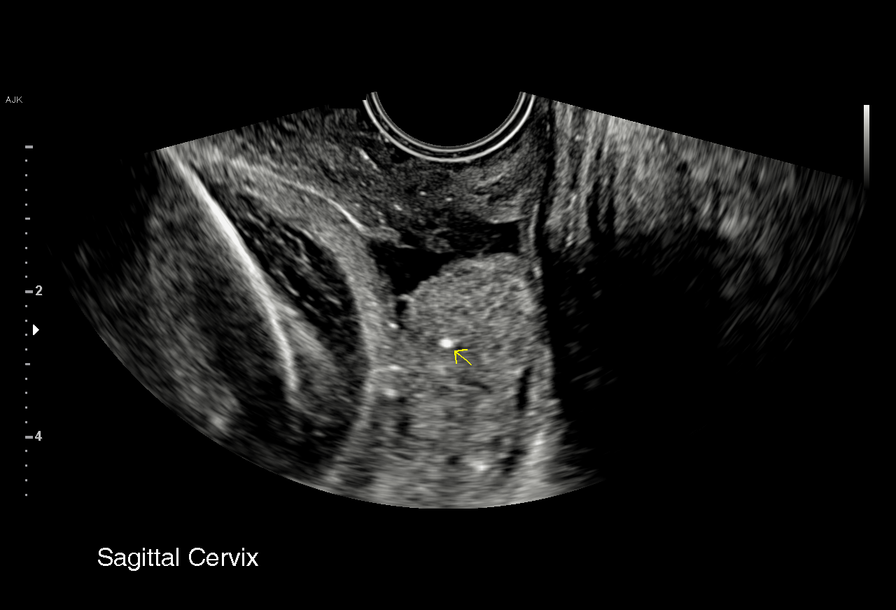
[im 4/9]
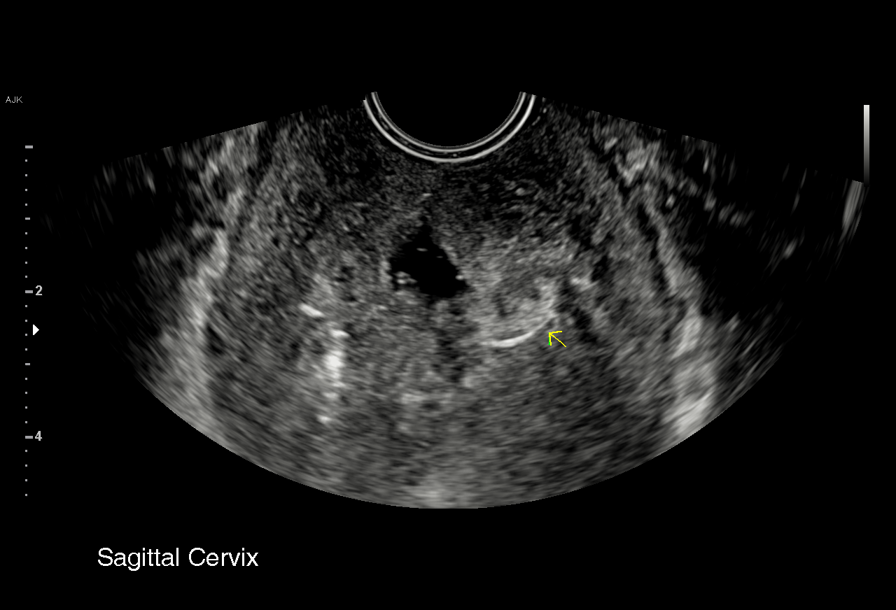
[im 5/9]
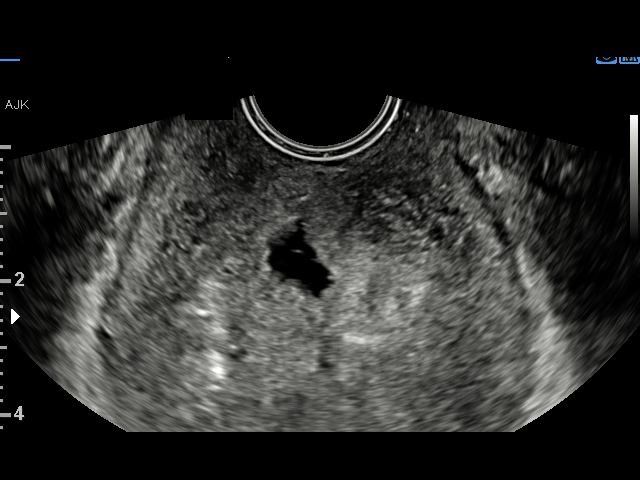
[im 6/9]
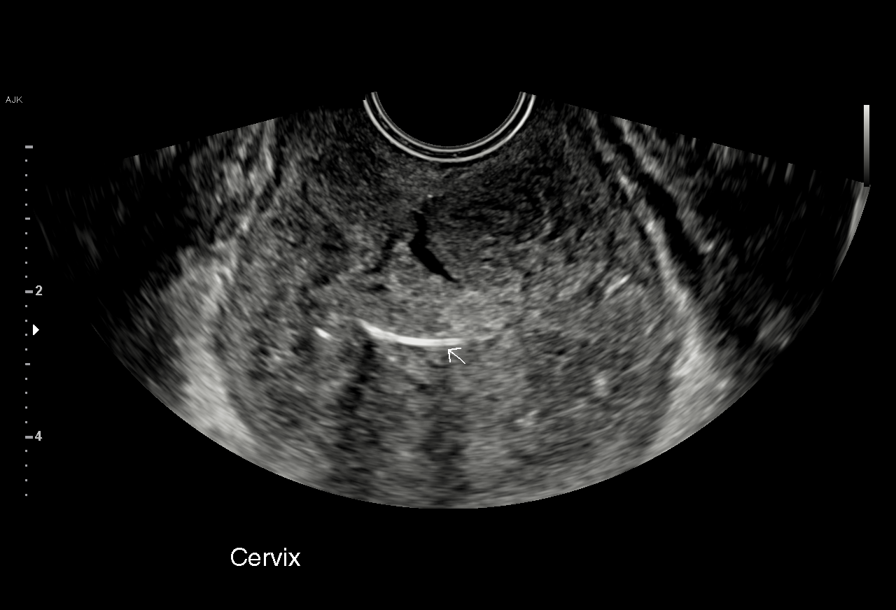
[im 7/9]
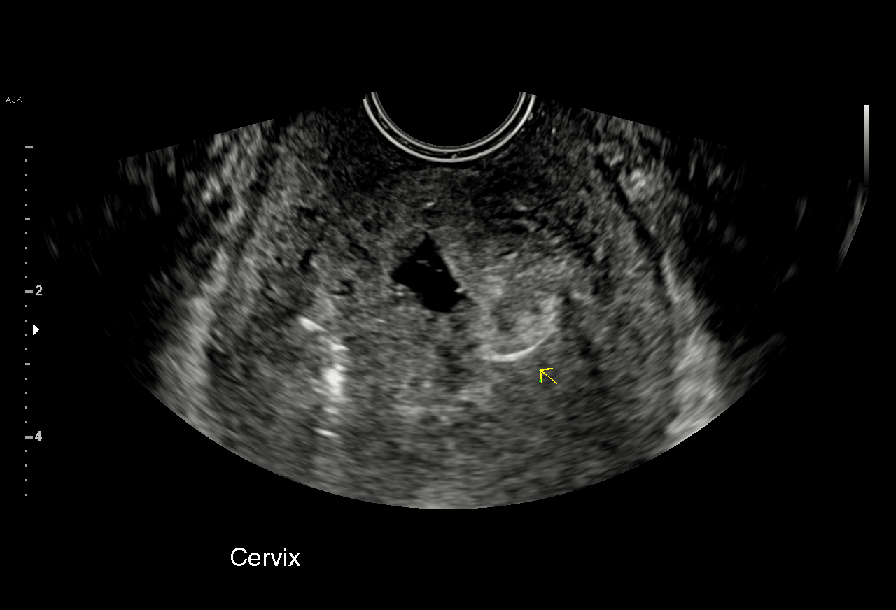
[im 8/9]
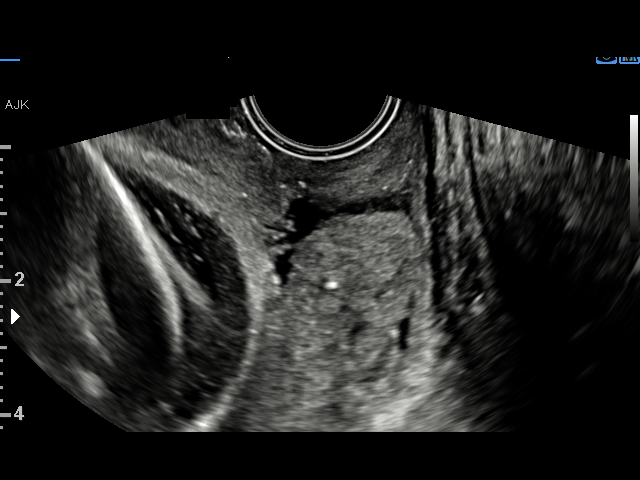
[im 9/9]
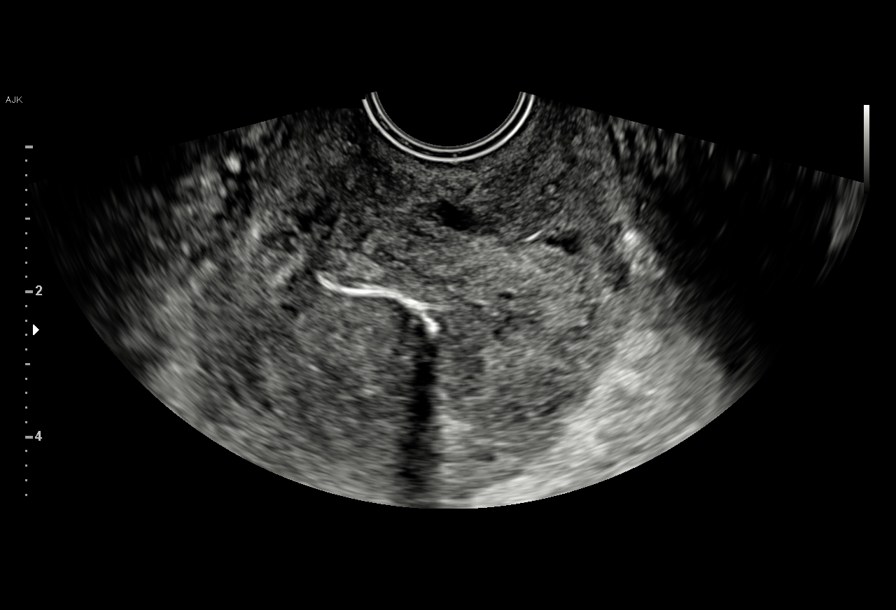

[9 of 9 positions shown; findings below may reference images not displayed]

KHEDDOO DO

  1  US MFM OB TRANSVAGINAL               76817.2      MAKAI ROLLINS
 ----------------------------------------------------------------------

 ----------------------------------------------------------------------
Indications

  35 weeks gestation of pregnancy
  Cervical cerclage suture present, third
  trimester
  Poor obstetric history: Previous preterm
  delivery, antepartum (68w5d)
  Preterm labor
 ----------------------------------------------------------------------
Vital Signs

                                                Height:        5'3"
Fetal Evaluation

 Num Of Fetuses:         1
 Cardiac Activity:       Observed
OB History

 Gravidity:    3         Prem:   1         SAB:   1
Gestational Age

 Clinical EDD:  35w 2d                                        EDD:   03/17/19
 Best:          35w 2d     Det. By:  Clinical EDD             EDD:   03/17/19
Cervix Uterus Adnexa

 Cervix
 Cerclage visualized.
Impression

 Patient had cerclage removed, but incomplete removal was
 suspected.
 We performed transvaginal ultrasound to evaluate the cervix.
 The cervical canal is dilated consistent with early labor. Both
 sagittal and transverse views showed the cerclage (part of) in
 the right posterior cervix, but it is not encircling the cervix.
 Therefore, it is unlikely to hamper progression of labor and
 vaginal delivery.
 Removal may be attempted after delivery or later. It is
 unlikely to lead to adverse outcomes (infection).
                 Boksoon, Hae-Seong

## 2023-07-10 IMAGING — CT CT ABD-PELV W/ CM
2 of 4 series · 16 of 46 positions shown, 18 images · IV contrast (APPLIED)
Comparison: None.

CLINICAL DATA: Ten days post C-section.  Right lower quadrant pain.

EXAM:
CT ABDOMEN AND PELVIS WITH CONTRAST
TECHNIQUE: Multidetector CT imaging of the abdomen and pelvis was performed
using the standard protocol following bolus administration of
intravenous contrast.
CONTRAST:  75mL OMNIPAQUE IOHEXOL 300 MG/ML  SOLN

[Series 3: abdomen 5.0 · axial · 0.77mm/px · z∈[-293,+67]mm · 13 of 80 slices shown, 15 images]
[im 4/80  soft-tissue]
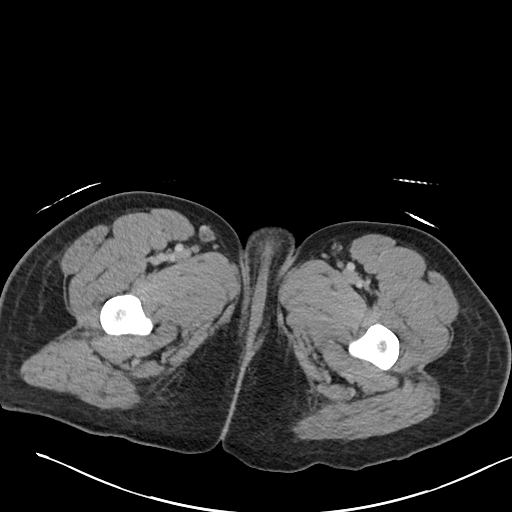
[im 4/80  bone]
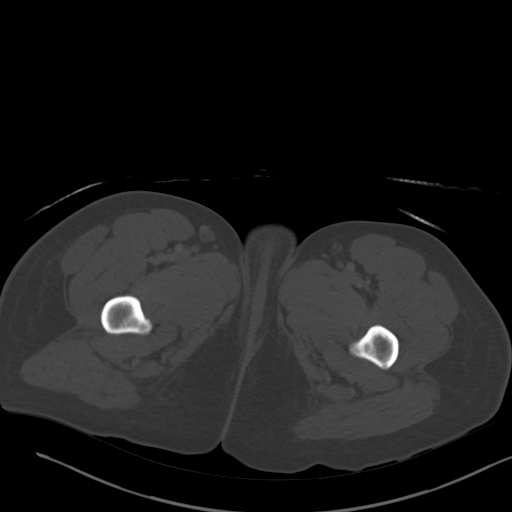
[im 12/80  soft-tissue]
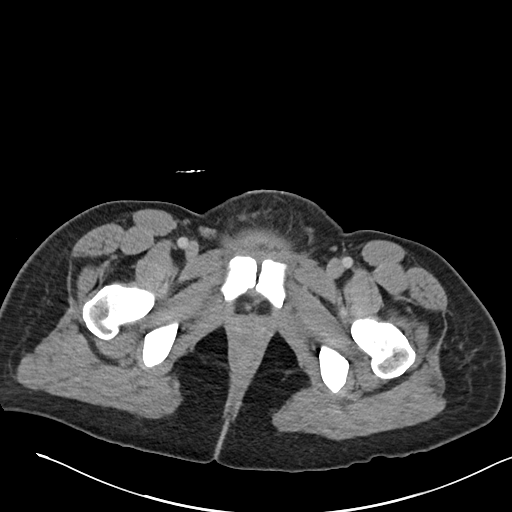
[im 16/80  soft-tissue]
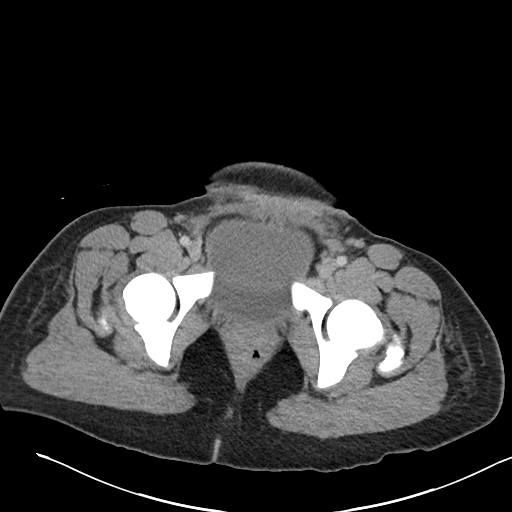
[im 23/80  soft-tissue]
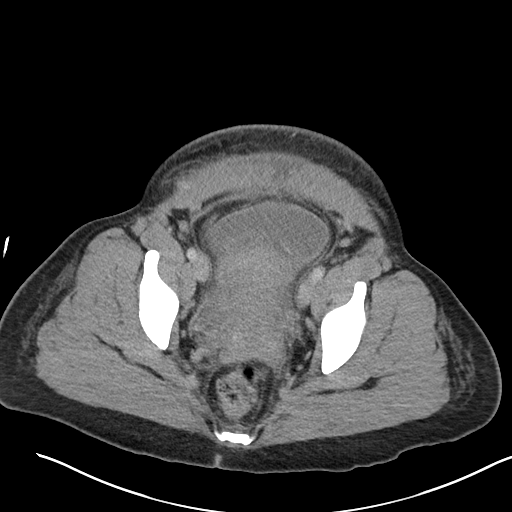
[im 27/80  soft-tissue]
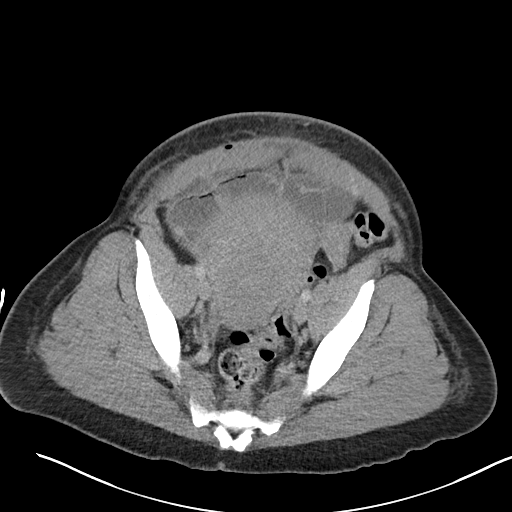
[im 34/80  soft-tissue]
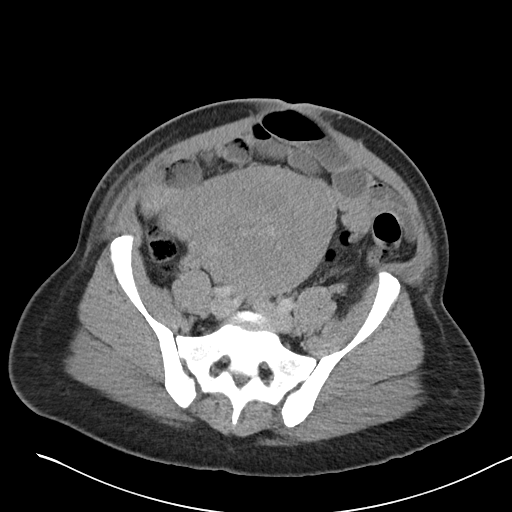
[im 42/80  soft-tissue]
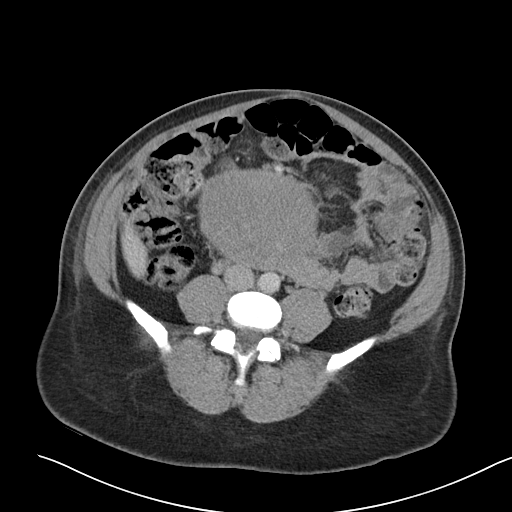
[im 46/80  soft-tissue]
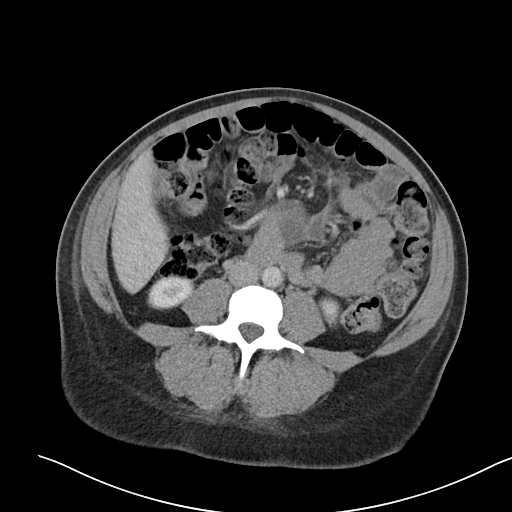
[im 53/80  soft-tissue]
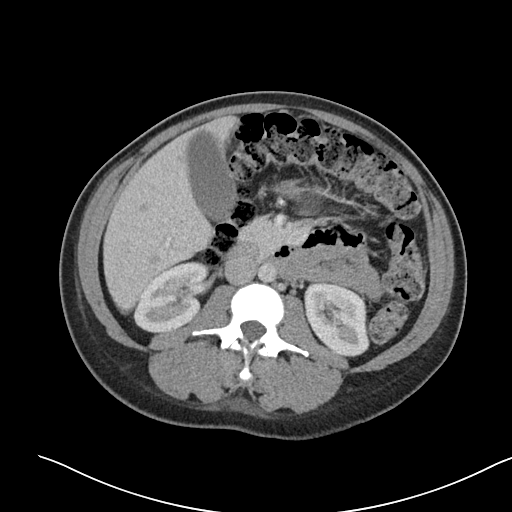
[im 53/80  bone]
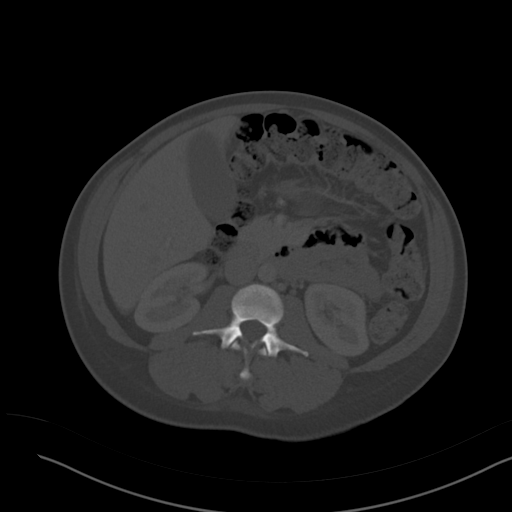
[im 57/80  soft-tissue]
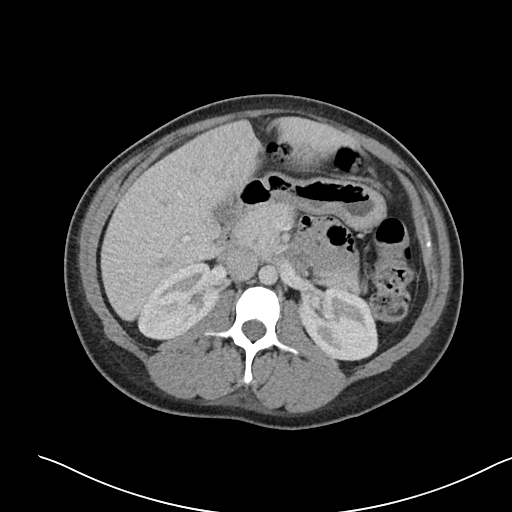
[im 64/80  soft-tissue]
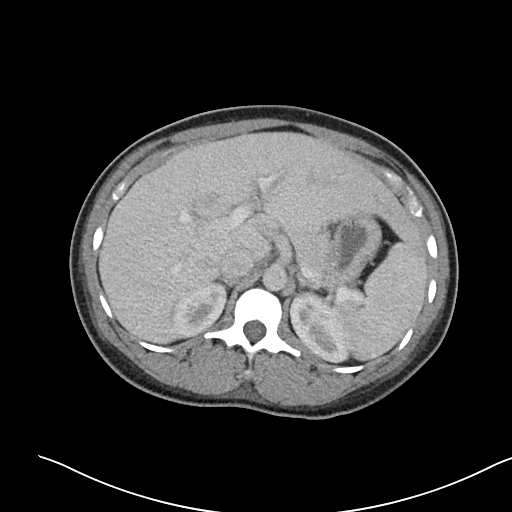
[im 68/80  soft-tissue]
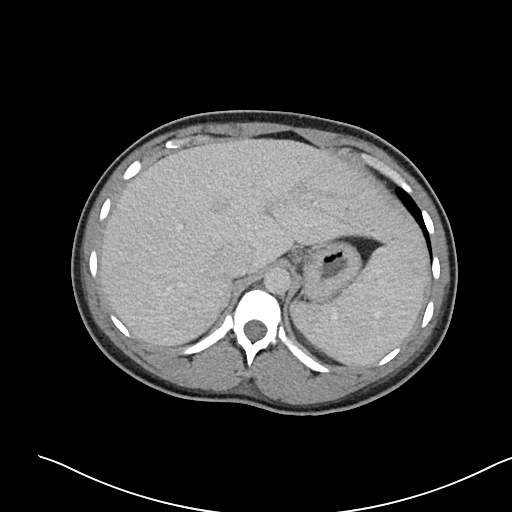
[im 76/80  soft-tissue]
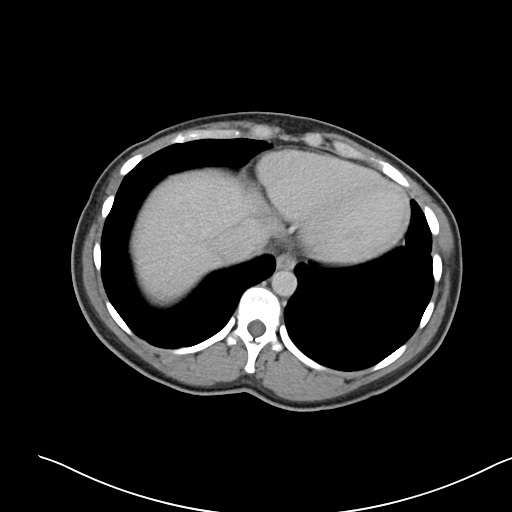

[Series 6: abdomen 3.0 mpr cor · coronal · 0.75mm/px · 3 of 101 slices shown]
[im 34/101  soft-tissue]
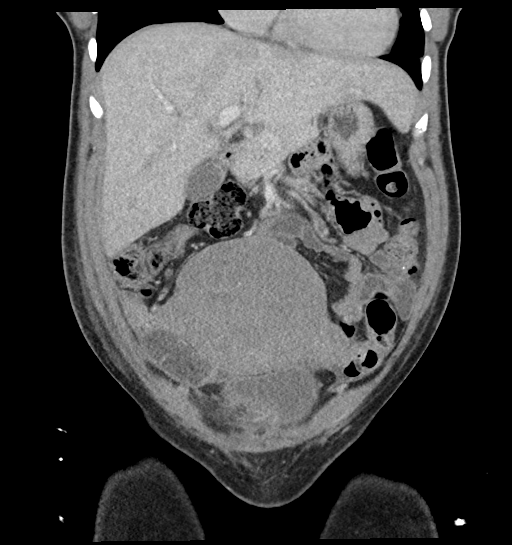
[im 45/101  soft-tissue]
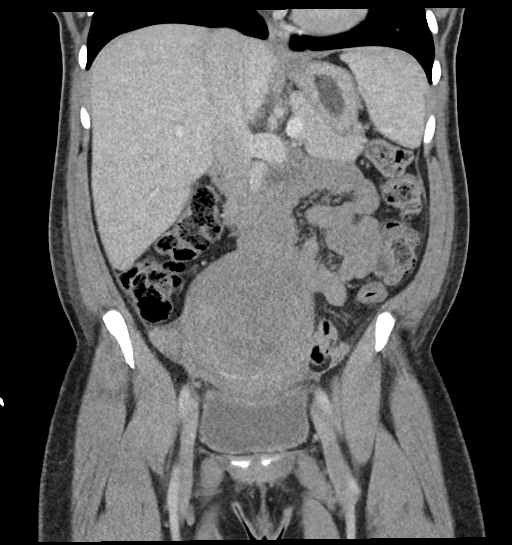
[im 56/101  soft-tissue]
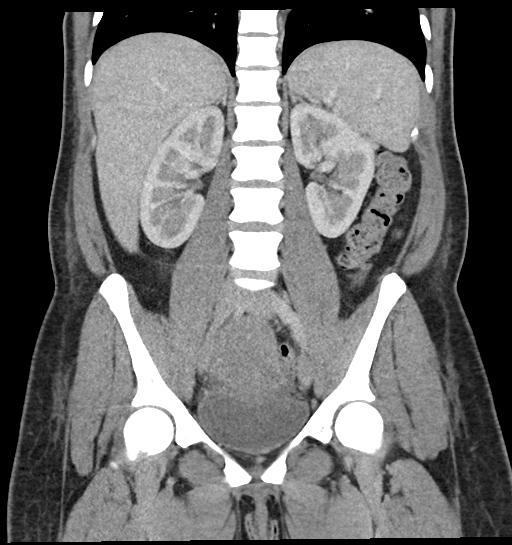

[16 of 46 positions shown; findings below may reference images not displayed]

FINDINGS: Lower chest: No acute abnormality.

Hepatobiliary: No focal liver abnormality is seen. No gallstones,
gallbladder wall thickening, or biliary dilatation.

Pancreas: Unremarkable. No pancreatic ductal dilatation or
surrounding inflammatory changes.

Spleen: Normal in size without focal abnormality.

Adrenals/Urinary Tract: Adrenal glands are unremarkable. Kidneys are
normal, without renal calculi, focal lesion, or hydronephrosis.
Bladder is unremarkable.

Stomach/Bowel: Stomach is within normal limits. Appendix appears
normal. No evidence of bowel wall thickening, distention, or
inflammatory changes.

Vascular/Lymphatic: No significant vascular findings are present. No
enlarged abdominal or pelvic lymph nodes.

Reproductive: The uterus is enlarged compatible with recent
delivery/C-section. No surrounding inflammation. Adnexa are grossly
within normal limits.

Other: There is trace right adnexal fluid. There is no free
intraperitoneal air. There is anterior abdominal wall stranding and
a small amount of air compatible with recent C-section. There is a
tiny fluid collection abutting the muscular wall containing small
focus of air. This collection measures 4.2 cm in craniocaudad
dimension and 4.0 x 0.8 cm in largest transverse in AP dimension.
There is no focal abdominal wall hernia.

Musculoskeletal: No acute or significant osseous findings.
IMPRESSION: 1. Enlarged uterus with anterior abdominal wall stranding and air
compatible with recent C-section. There is a thin fluid collection
within the anterior abdominal wall containing air. Findings may be
related to postoperative seroma, resolving hematoma or early abscess
in the appropriate clinical setting.
2. There is trace free fluid in the right adnexa.
3. No other acute localizing process in the abdomen or pelvis.
